# Patient Record
Sex: Female | Born: 1970 | Race: White | Hispanic: No | Marital: Married | Smoking: Current every day smoker
Health system: Southern US, Community
[De-identification: ages and names within clinical notes are randomized; demographics above are authoritative.]

## PROBLEM LIST (undated history)

## (undated) DIAGNOSIS — R569 Unspecified convulsions: Secondary | ICD-10-CM

## (undated) DIAGNOSIS — E079 Disorder of thyroid, unspecified: Secondary | ICD-10-CM

## (undated) DIAGNOSIS — N858 Other specified noninflammatory disorders of uterus: Secondary | ICD-10-CM

## (undated) DIAGNOSIS — M51369 Other intervertebral disc degeneration, lumbar region without mention of lumbar back pain or lower extremity pain: Secondary | ICD-10-CM

## (undated) DIAGNOSIS — I7300 Raynaud's syndrome without gangrene: Secondary | ICD-10-CM

## (undated) DIAGNOSIS — T7840XA Allergy, unspecified, initial encounter: Secondary | ICD-10-CM

## (undated) DIAGNOSIS — K219 Gastro-esophageal reflux disease without esophagitis: Secondary | ICD-10-CM

## (undated) DIAGNOSIS — M5136 Other intervertebral disc degeneration, lumbar region: Secondary | ICD-10-CM

## (undated) DIAGNOSIS — Z9889 Other specified postprocedural states: Secondary | ICD-10-CM

## (undated) DIAGNOSIS — T8859XA Other complications of anesthesia, initial encounter: Secondary | ICD-10-CM

## (undated) DIAGNOSIS — F419 Anxiety disorder, unspecified: Secondary | ICD-10-CM

## (undated) DIAGNOSIS — T4145XA Adverse effect of unspecified anesthetic, initial encounter: Secondary | ICD-10-CM

## (undated) DIAGNOSIS — F329 Major depressive disorder, single episode, unspecified: Secondary | ICD-10-CM

## (undated) DIAGNOSIS — F32A Depression, unspecified: Secondary | ICD-10-CM

## (undated) DIAGNOSIS — R112 Nausea with vomiting, unspecified: Secondary | ICD-10-CM

## (undated) DIAGNOSIS — G8929 Other chronic pain: Secondary | ICD-10-CM

## (undated) HISTORY — DX: Unspecified convulsions: R56.9

## (undated) HISTORY — DX: Disorder of thyroid, unspecified: E07.9

## (undated) HISTORY — DX: Other specified noninflammatory disorders of uterus: N85.8

## (undated) HISTORY — PX: TUBAL LIGATION: SHX77

## (undated) HISTORY — PX: BREAST SURGERY: SHX581

## (undated) HISTORY — DX: Major depressive disorder, single episode, unspecified: F32.9

## (undated) HISTORY — DX: Allergy, unspecified, initial encounter: T78.40XA

## (undated) HISTORY — PX: LAPAROSCOPIC TUBAL LIGATION: SUR803

## (undated) HISTORY — DX: Depression, unspecified: F32.A

---

## 2006-08-30 ENCOUNTER — Inpatient Hospital Stay (HOSPITAL_COMMUNITY): Admission: AD | Admit: 2006-08-30 | Discharge: 2006-08-30 | Payer: Self-pay | Admitting: Obstetrics and Gynecology

## 2007-01-01 ENCOUNTER — Inpatient Hospital Stay (HOSPITAL_COMMUNITY): Admission: RE | Admit: 2007-01-01 | Discharge: 2007-01-05 | Payer: Self-pay | Admitting: Obstetrics and Gynecology

## 2007-06-19 ENCOUNTER — Encounter: Admission: RE | Admit: 2007-06-19 | Discharge: 2007-07-06 | Payer: Self-pay | Admitting: Obstetrics and Gynecology

## 2007-07-15 ENCOUNTER — Encounter: Admission: RE | Admit: 2007-07-15 | Discharge: 2007-07-19 | Payer: Self-pay | Admitting: Obstetrics and Gynecology

## 2008-09-07 ENCOUNTER — Inpatient Hospital Stay (HOSPITAL_COMMUNITY): Admission: AD | Admit: 2008-09-07 | Discharge: 2008-09-08 | Payer: Self-pay | Admitting: Obstetrics and Gynecology

## 2008-09-08 ENCOUNTER — Encounter: Payer: Self-pay | Admitting: Obstetrics and Gynecology

## 2008-10-30 ENCOUNTER — Encounter (INDEPENDENT_AMBULATORY_CARE_PROVIDER_SITE_OTHER): Payer: Self-pay | Admitting: Obstetrics and Gynecology

## 2008-10-30 ENCOUNTER — Inpatient Hospital Stay (HOSPITAL_COMMUNITY): Admission: RE | Admit: 2008-10-30 | Discharge: 2008-11-02 | Payer: Self-pay | Admitting: Obstetrics and Gynecology

## 2009-06-08 IMAGING — US US ABDOMEN COMPLETE
1 series · 14 of 25 positions shown · non-contrast
Comparison: None

CLINICAL DATA: Epigastric abdominal pain and nausea.  31 weeks
pregnant.

ABDOMINAL ULTRASOUND COMPLETE

[Series 1: us abdomen complete · 0.25mm/px · 14 of 71 slices shown]
[im 1/71]
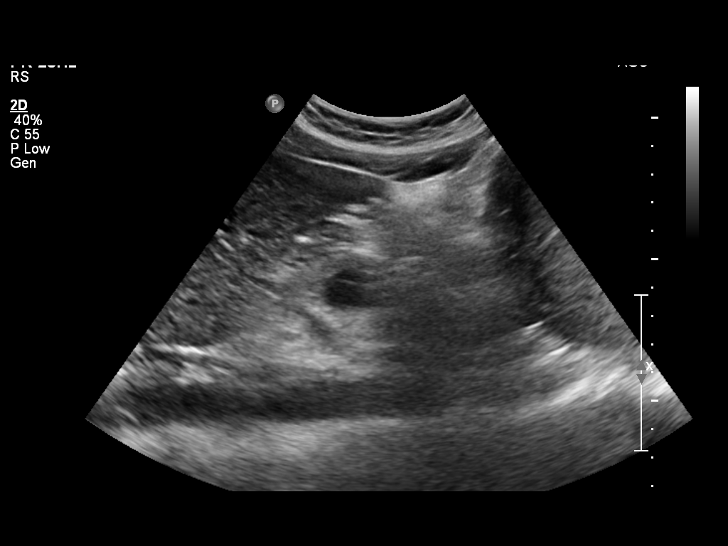
[im 6/71]
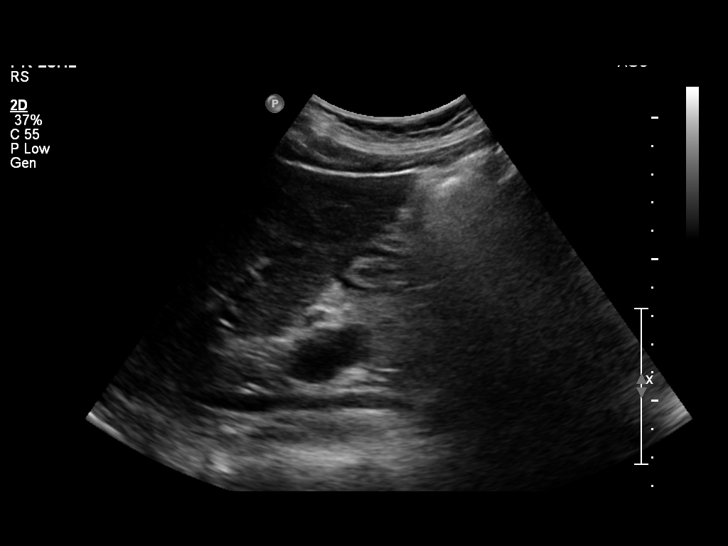
[im 12/71]
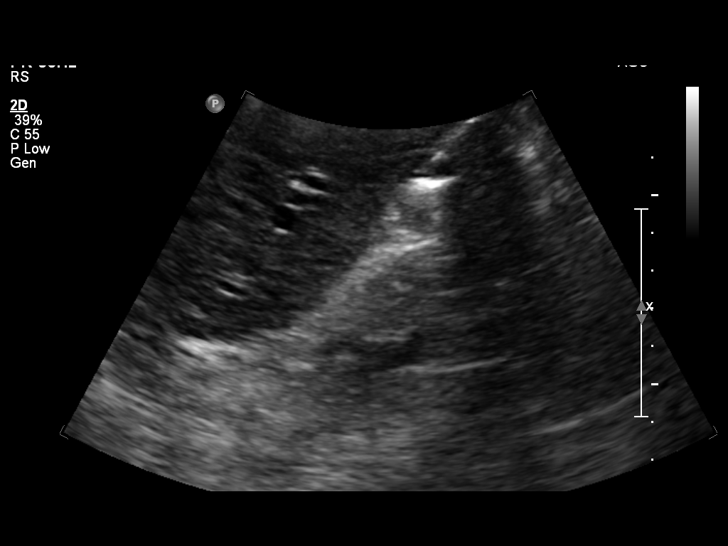
[im 18/71]
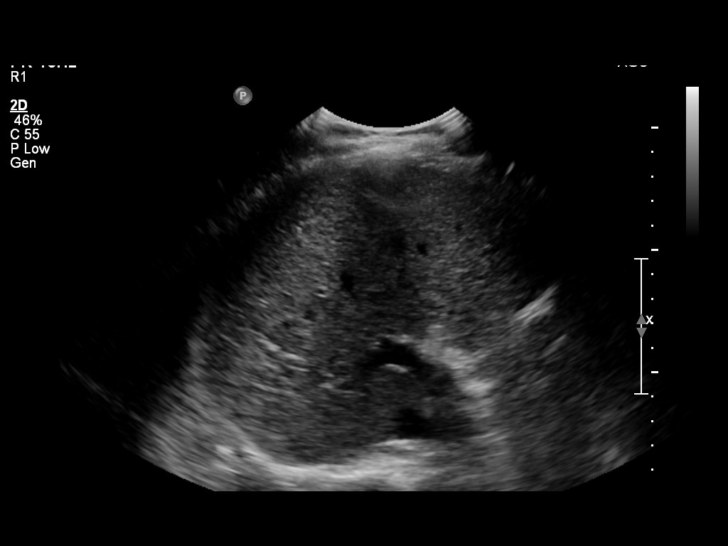
[im 24/71]
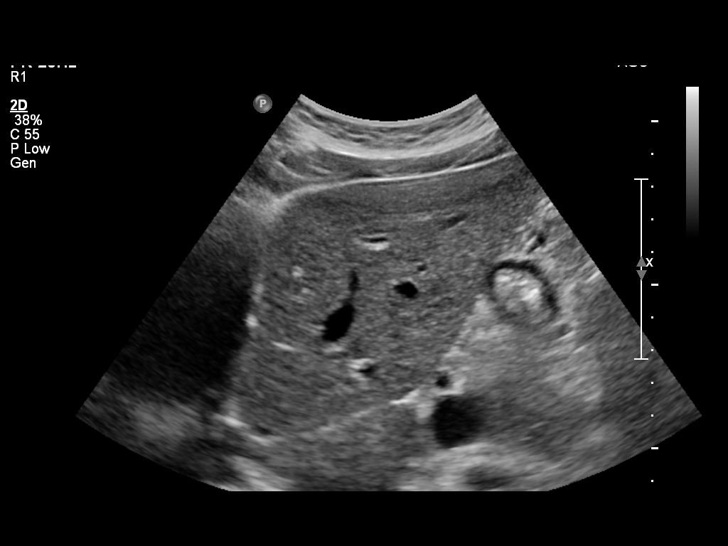
[im 27/71]
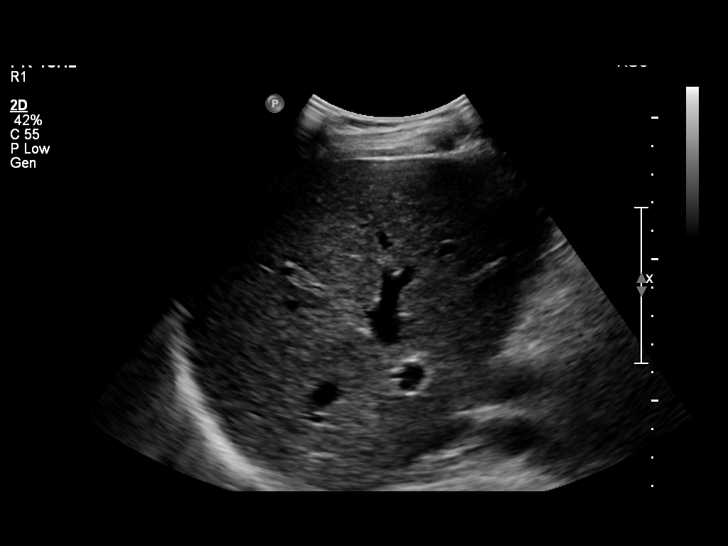
[im 33/71]
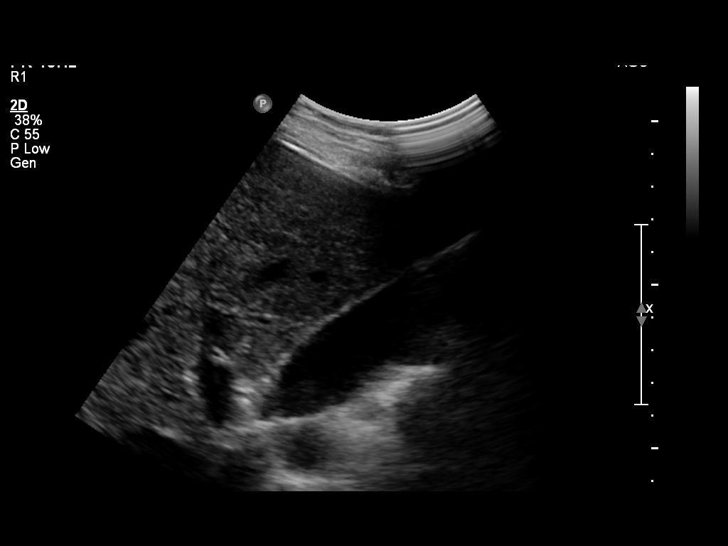
[im 38/71]
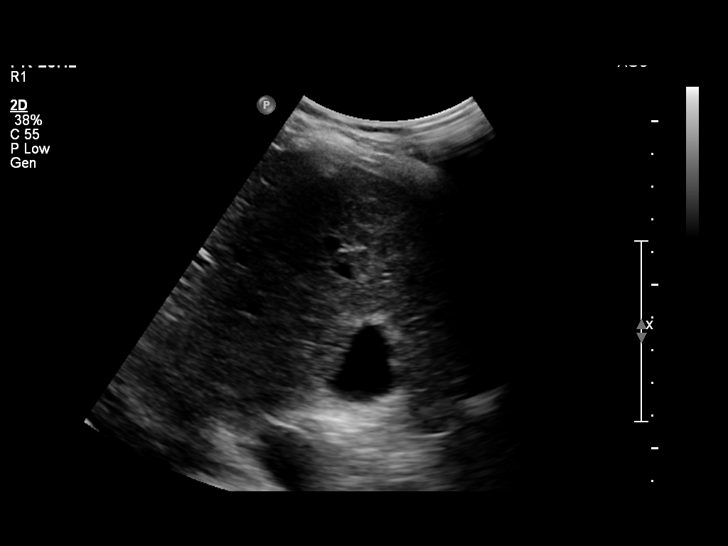
[im 44/71]
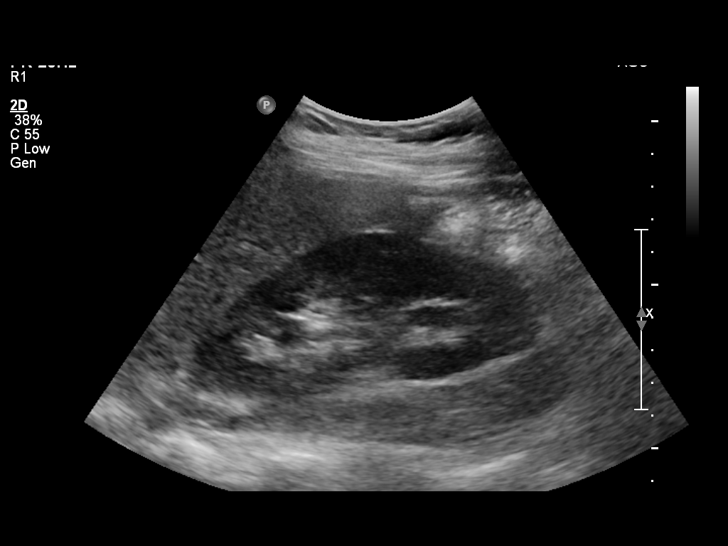
[im 47/71]
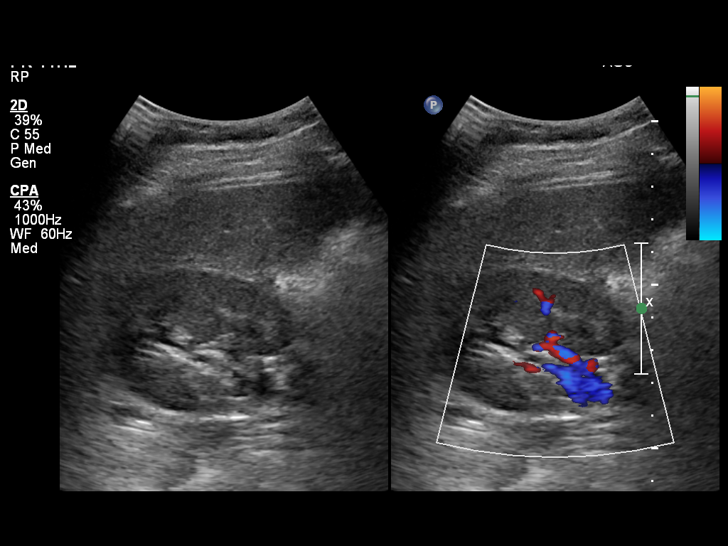
[im 53/71]
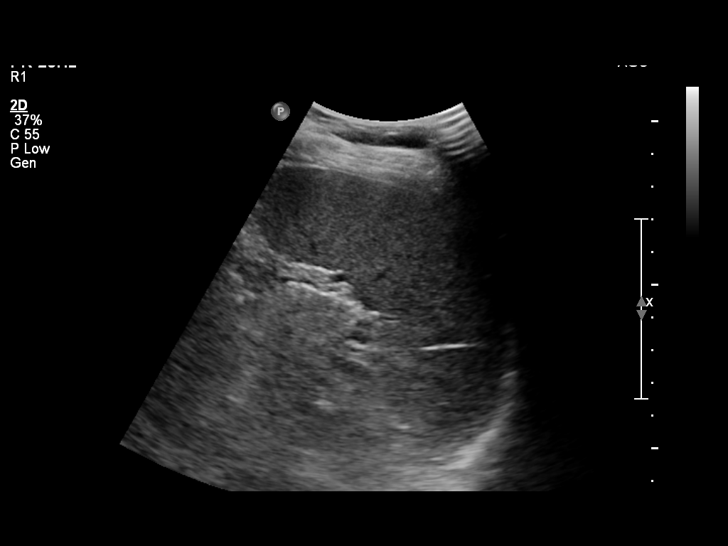
[im 59/71]
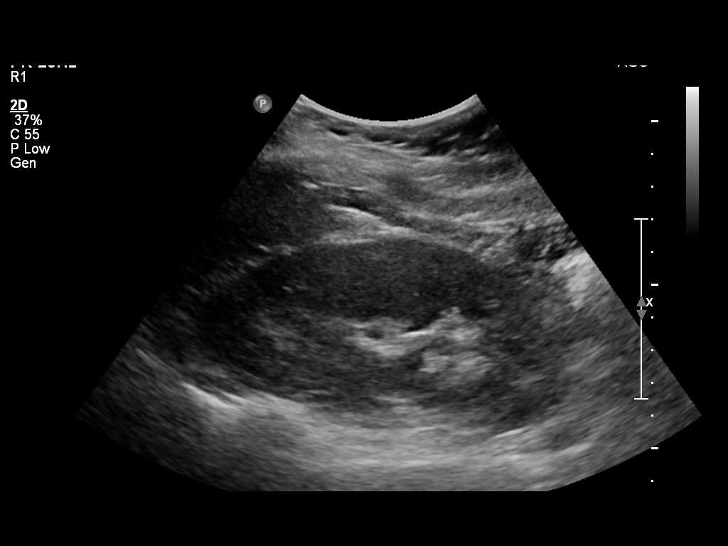
[im 65/71]
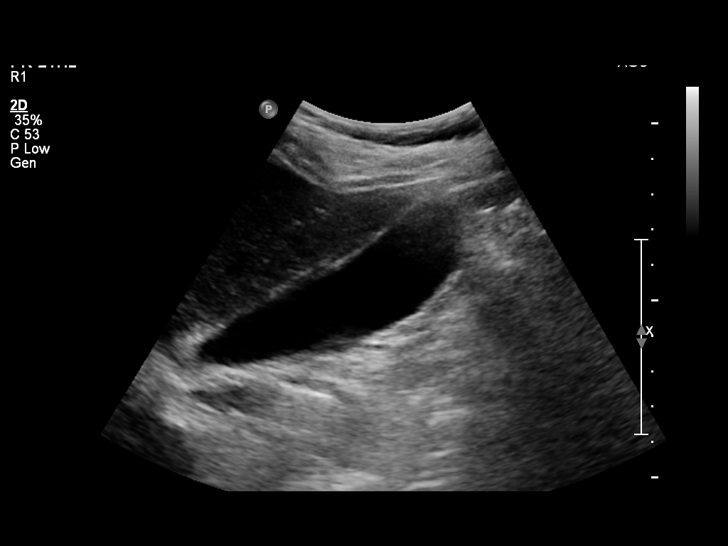
[im 71/71]
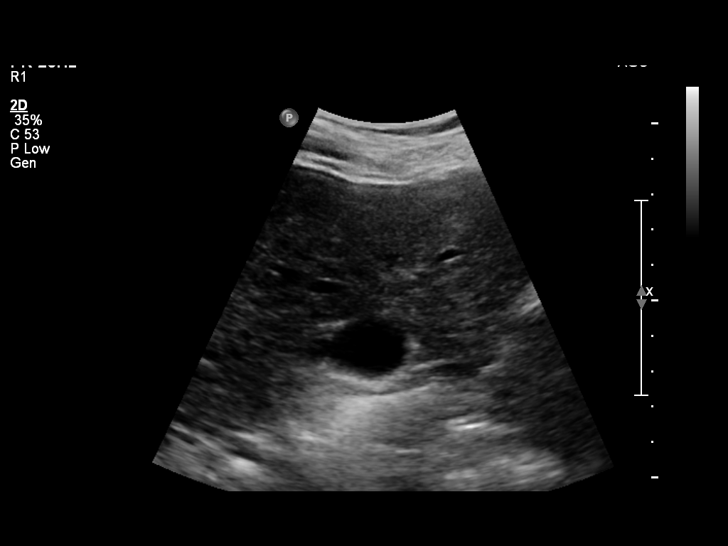

[14 of 25 positions shown; findings below may reference images not displayed]

FINDINGS: Gallbladder:  No gallstones, gallbladder wall thickening, or
pericholecystic fluid.

Common bile duct: Within normal limits in caliber.

Liver:  No focal lesion identified.  Within normal limits in
parenchymal echogenicity.

Inferior vena cava:  Intrahepatic portion appears normal.

Pancreas:  Although entire pancreas not visualized due to bowel
gas, no abnormality identified.

Spleen:  Within normal limits in size and echogenicity.

Right kidney:  Normal in size and parenchymal echogenicity.  No
evidence of mass or hydronephrosis.

Left kidney:  Normal in size and parenchymal echogenicity.   No
evidence of mass or hydronephrosis.

Abdominal aorta:  No aneurysm identified.
IMPRESSION: Negative abdominal ultrasound.  No evidence of gallstones or
hydronephrosis.

## 2010-06-22 ENCOUNTER — Encounter (INDEPENDENT_AMBULATORY_CARE_PROVIDER_SITE_OTHER): Payer: Self-pay | Admitting: *Deleted

## 2010-06-29 NOTE — Letter (Signed)
Summary: New Patient letter  [facility]  [address]   [place], [place] [address]   Phone: [phone]  Fax: [phone]       06/22/2010 MRN: [id number]  [facility] [place] [address] [place], [place]  [address]  Dear Ms. Elmore,  Welcome to the Gastroenterology Division at [facility].    You are scheduled to see Dr.  Jarold Motto on 07/28/2010 at 10:45 on the 3rd floor at [facility], [address]  We ask that you try to arrive at our office 15 minutes prior to your appointment time to allow for check-in.  We would like you to complete the enclosed self-administered evaluation form prior to your visit and bring it with you on the day of your appointment.  We will review it with you.  Also, please bring a complete list of all your medications or, if you prefer, bring the medication bottles and we will list them.  Please bring your insurance card so that we may make a copy of it.  If your insurance requires a referral to see a specialist, please bring your referral form from your primary care physician.  Co-payments are due at the time of your visit and may be paid by cash, check or credit card.     Your office visit will consist of a consult with your physician (includes a physical exam), any laboratory testing he/she may order, scheduling of any necessary diagnostic testing (e.g. x-ray, ultrasound, CT-scan), and scheduling of a procedure (e.g. Endoscopy, Colonoscopy) if required.  Please allow enough time on your schedule to allow for any/all of these possibilities.    If you cannot keep your appointment, please call [phone] to cancel or reschedule prior to your appointment date.  This allows [place] the opportunity to schedule an appointment for another patient in need of care.  If you do not cancel or reschedule by 5 p.m. the business day prior to your appointment date, you will be charged a $50.00 late cancellation/no-show fee.    Thank you for choosing  [facility] for your medical needs.  We appreciate the opportunity to care for you.  Please visit [place] at our website  to learn more about our practice.                     Sincerely,                                                             The Gastroenterology Division

## 2010-07-28 ENCOUNTER — Ambulatory Visit: Payer: Self-pay | Admitting: Gastroenterology

## 2010-08-15 LAB — TYPE AND SCREEN: Antibody Screen: NEGATIVE

## 2010-08-15 LAB — CBC
HCT: 31.1 % — ABNORMAL LOW (ref 36.0–46.0)
Hemoglobin: 11 g/dL — ABNORMAL LOW (ref 12.0–15.0)
Hemoglobin: 8.4 g/dL — ABNORMAL LOW (ref 12.0–15.0)
RBC: 2.64 MIL/uL — ABNORMAL LOW (ref 3.87–5.11)
RBC: 3.47 MIL/uL — ABNORMAL LOW (ref 3.87–5.11)
WBC: 7.3 10*3/uL (ref 4.0–10.5)

## 2010-08-15 LAB — CCBB MATERNAL DONOR DRAW

## 2010-08-16 LAB — COMPREHENSIVE METABOLIC PANEL
ALT: 18 U/L (ref 0–35)
Albumin: 2.8 g/dL — ABNORMAL LOW (ref 3.5–5.2)
Alkaline Phosphatase: 77 U/L (ref 39–117)
BUN: 7 mg/dL (ref 6–23)
Chloride: 109 mEq/L (ref 96–112)
Glucose, Bld: 107 mg/dL — ABNORMAL HIGH (ref 70–99)
Potassium: 3.6 mEq/L (ref 3.5–5.1)
Sodium: 135 mEq/L (ref 135–145)
Total Bilirubin: 0.6 mg/dL (ref 0.3–1.2)

## 2010-08-16 LAB — D-DIMER, QUANTITATIVE

## 2010-08-16 LAB — CBC
HCT: 31.2 % — ABNORMAL LOW (ref 36.0–46.0)
MCV: 92.3 fL (ref 78.0–100.0)
RBC: 3.38 MIL/uL — ABNORMAL LOW (ref 3.87–5.11)
WBC: 6.7 10*3/uL (ref 4.0–10.5)

## 2010-08-16 LAB — LIPASE, BLOOD: Lipase: 20 U/L (ref 11–59)

## 2010-09-20 NOTE — Discharge Summary (Signed)
[name], [name]               ACCOUNT NO.:  [id number]   MEDICAL RECORD NO.:  [id number]          PATIENT TYPE:  INP   LOCATION:  9108                          [facility]ILITY:  [facility]   PHYSICIAN:  Michelle L. Grewal, M.D.DATE OF BIRTH:  1971-02-27   DATE OF ADMISSION:  10/30/2008  DATE OF DISCHARGE:  11/02/2008                               DISCHARGE SUMMARY   ADMITTING DIAGNOSES:  1. Intrauterine pregnancy at term.  2. Previous cesarean section, desires repeat.   DISCHARGE DIAGNOSES:  1. Status post low transverse cesarean section.  2. A viable female infant.   PROCEDURE:  Repeat low transverse cesarean section.   REASON FOR ADMISSION:  Please see dictated H and P.   HOSPITAL COURSE:  The patient is a 40 year old gravida 70, para 5-0-5-5,  that was admitted to [facility] for scheduled cesarean  section.  The patient had had 5 previous cesarean deliveries and desired  repeat.  On the morning of admission, the patient was taken to the  operating room, where spinal anesthesia was administered without  difficulty.  A low transverse incision was made, delivery of a viable  female infant, weighing 8 pounds 5 ounces with Apgars of 9 at 1 minute and  9 at 5 minutes.  The patient tolerated the procedure well and was taken  to the recovery room in stable condition.  On postoperative day #1, the  patient was without complaint.  Vital signs were stable.  She was  afebrile.  Abdomen was soft.  The abdominal dressings was noted to have  a small amount of drainage noted on the bandage.  Foley had been  discontinued and she is voiding well.  Laboratory findings showed  hemoglobin 8.4, platelet count 177,000.  On postoperative day #2, the  patient was considering early discharge.  Vital signs were stable.  Abdomen was soft.  Fundus was firm and nontender.  Incision was clean,  dry, and intact with subcuticular closure.  Discharge instructions  reviewed, however, the patient later  decided against discharge.  On  postoperative day #3, the patient was without complaint.  Vital signs  remained stable.  She was afebrile.  Fundus was firm and nontender.  Incision was clean, dry, and intact.  Discharge instructions reviewed  and the patient was later discharged home.   CONDITION ON DISCHARGE:  Stable.   DIET:  Regular as tolerated.   ACTIVITY:  No heavy lifting, no driving x2 weeks, and no vaginal entry.   FOLLOWUP:  The patient is to follow up in the office in 1-2 weeks for an  incision check.  She is to call for temperature greater than 100  degrees, persistent nausea, vomiting, heavy vaginal bleeding, and/or  redness or drainage from the incisional site.   DISCHARGE MEDICATIONS:  1. Percocet 5/325, #30 one p.o. q.4-6 h. p.r.n.  2. Motrin 600 mg q.6 h.  3. Prenatal vitamins 1 p.o. daily.      Julio Sicks, N.P.      Stann Mainland. Vincente Poli, M.D.  Electronically Signed    CC/MEDQ  D:  11/02/2008  T:  11/02/2008  Job:  [id number]

## 2010-09-20 NOTE — Op Note (Signed)
[name], [name]               ACCOUNT NO.:  [id number]   MEDICAL RECORD NO.:  [id number]          PATIENT TYPE:  INP   LOCATION:  9110                          [facility]ACILITY:  [facility]   PHYSICIAN:  Duke Salvia. Marcelle Overlie, M.D.DATE OF BIRTH:  Feb 02, 1971   DATE OF PROCEDURE:  01/01/2007  DATE OF DISCHARGE:                               OPERATIVE REPORT   PREOPERATIVE DIAGNOSIS:  Term intrauterine pregnancy, previous cesarean  section x4.   POSTOPERATIVE DIAGNOSIS:  Term intrauterine pregnancy, previous cesarean  section x4.   PROCEDURE:  Repeat low transverse cesarean section.   SURGEON:  Duke Salvia. Marcelle Overlie, M.D.   ANESTHESIA:  Spinal.   COMPLICATIONS:  None.   DRAINS:  Foley catheter.   BLOOD LOSS:  700 mL.   PROCEDURE AND FINDINGS:  The patient taken to operating room.  After an  adequate level of spinal anesthetic was obtained with the patient in a  supine position, the abdomen prepped and draped in usual manner for  sterile abdominal procedure.  Foley catheter positioned draining clear  urine.  Transverse incision made excising the old scar which was carried  down to the fascia.  The fascia incised and extended transversely.  Rectus muscle divided in the midline.  Peritoneum entered superiorly  without incident and extended vertically.  The vesicouterine serosa was  then incised and the bladder was bluntly and sharply dissected below,  bladder blade repositioned.  Transverse incision made in the lower  segment extended with bandage scissors.  Clear fluid noted.  The patient  then delivered of a healthy female Apgars 9 and 9, weight 6 pounds 15  ounces.  The infant was suctioned, cord clamped and passed to pediatric  team for further care.  Placenta was then delivered manually intact.  Uterus was exteriorized, cavity wiped clean with laparotomy pack.  Closure obtained first layer 0 chromic in a locked fashion followed by  an imbricating layer of 0 chromic.  This was hemostatic.   The bladder  flap area was inspected carefully and noted be intact hemostatic with  clear urine at that point.  Bilateral tubes and ovaries were normal.  Prior to closure sponge, needle and instrument counts reported as  correct x2.  Peritoneum closed with running 2-0 Vicryl suture.  Rectus  muscles were reapproximated with 3-0 Vicryl interrupted sutures.  The 0  PDS running suture used from laterally to  midline on either side.  The subcutaneous tissue was undermined to  reduce tension, made hemostatic with the Bovie.  Skin was stapled with  Steri-Strips and a pressure dressing applied.  Clear urine noted at the  end of the case.  She did receive Ancef 1 gram IV along Pitocin IV after  the cord was clamped.  Mother and baby doing well at that point.      [name] Marcelle Overlie, M.D.  Electronically Signed     RMH/MEDQ  D:  01/01/2007  T:  01/02/2007  Job:  [id number]

## 2010-09-20 NOTE — H&P (Signed)
[name], [name]               ACCOUNT NO.:  [id number]   MEDICAL RECORD NO.:  [id number]          PATIENT TYPE:  INP   LOCATION:                                [facility]   PHYSICIAN:  Duke Salvia. Marcelle Overlie, M.D.    DATE OF BIRTH:   DATE OF ADMISSION:  01/01/2007  DATE OF DISCHARGE:                              HISTORY & PHYSICAL   Date of scheduled surgery is August 26.   CHIEF COMPLAINT:  Repeat cesarean section at term.   HISTORY OF PRESENT ILLNESS:  A 40 year old G9, P4-0-4-4 at term.  Had a  normal genetic amniocentesis who presents now for RCS.  She has had  three previous cesarean sections and has also had a tubal reversal.  This procedure including risks of bleeding, infection, transfusion,  possible need for additional surgery, other risks related to wound  infection, phlebitis, adjacent organ injury all reviewed with her which  she understands and accepts.   ALLERGIES:  None.   OPERATIONS:  Cesarean section, tubal reversal.   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   PHYSICAL EXAMINATION:  VITAL SIGNS:  Temperature 98.2, blood pressure  120/78.  HEENT:  Unremarkable.  NECK:  Supple without masses.  LUNGS:  Clear.  CARDIOVASCULAR:  Regular rate and rhythm without murmurs, rubs or  gallops.  BREASTS:  Without masses.  ABDOMEN:  Term fundal height, fetal heart rate 140, cervix closed.  EXTREMITIES/NEUROLOGICAL:  Unremarkable.   IMPRESSION:  1. Term intrauterine pregnancy.  2. Previous cesarean section x4.   PLAN:  Repeat cesarean section.  Procedure and risks reviewed as above.      [name] Marcelle Overlie, M.D.  Electronically Signed     RMH/MEDQ  D:  12/31/2006  T:  12/31/2006  Job:  [id number]

## 2010-09-20 NOTE — H&P (Signed)
NAMENIHAL, DOAN               ACCOUNT NO.:  [id number]   MEDICAL RECORD NO.:  [id number]          PATIENT TYPE:  INP   LOCATION:                                [facility]   PHYSICIAN:  Duke Salvia. Marcelle Overlie, M.D.DATE OF BIRTH:  Nov 11, 1970   DATE OF ADMISSION:  DATE OF DISCHARGE:                              HISTORY & PHYSICAL   CHIEF COMPLAINT:  For repeat cesarean section.   HISTORY OF PRESENT ILLNESS:  A 40 year old G11, P 5-0-5-5 who underwent  amniocentesis with this pregnancy with a normal karyotype.  She has had  5 previous cesarean sections.  The number of medical issues with this  pregnancy including depression (currently on Zoloft), chronic back pain  (which is being managed by her back specialist, for which she takes  narcotics or muscle relaxers p.r.n.).  Her history is also significant  for a history of prior tubal reversal and a history of gestational  diabetes.   As mentioned, she had a normal amniocentesis this pregnancy.  Her GBS  was negative.  She has used Skelaxin and Lidoderm topical patches, along  with narcotics for her chronic back pain.   The procedure of repeat cesarean section, including risks related to  bleeding, infection, adjacent organ injury are all reviewed with her;  transfusion risks, wound infection all discussed.   PAST MEDICAL HISTORY:  Please see her [employer] form for details.   ALLERGIES:  CT DYE, AUGMENTIN and CEFTIN.   SURGICAL HISTORY:  Please see the [employer] form.  1. Cesarean section times 5 previously.  2. Tubal reversal by Dr. Elesa Hacker.  3. Breast augmentation.   PHYSICAL EXAMINATION:  Temperature 98.2, blood pressure 128/72.  HEENT:  Unremarkable.  NECK:  Supple without masses.  LUNGS:  Clear.  CARDIOVASCULAR:  Regular rate and rhythm without murmurs, rubs or  gallops.  BREASTS:  Revealed implants.  No masses noted.  PELVIC:  Term fundal height.  Fetal heart rate 140.  Cervix is closed.  EXTREMITIES:  Unremarkable.  NEUROLOGIC:  Unremarkable.  Fetal heart rate 140.   IMPRESSION:  Term intrauterine pregnancy, previous cesarean section x5.   MEDICAL PROBLEMS:  As noted above.  Normal karyotype early in the  pregnancy; had amniocentesis.   PLAN:  Repeat cesarean section.  The procedure and risks reviewed as  above.      [name] Marcelle Overlie, M.D.  Electronically Signed     RMH/MEDQ  D:  10/29/2008  T:  10/29/2008  Job:  [id number]

## 2010-09-20 NOTE — Op Note (Signed)
[name], [name]               ACCOUNT NO.:  [id number]   MEDICAL RECORD NO.:  [id number]          PATIENT TYPE:  INP   LOCATION:  9108                          [facility]   PHYSICIAN:  Duke Salvia. Marcelle Overlie, M.D.DATE OF BIRTH:  17-Dec-1970   DATE OF PROCEDURE:  10/30/2008  DATE OF DISCHARGE:                               OPERATIVE REPORT   PREOPERATIVE DIAGNOSIS:  Repeat cesarean section at term.   POSTOPERATIVE DIAGNOSIS:  Repeat cesarean section at term.   PROCEDURE:  Repeat low transverse cesarean section.   SURGEON:  Duke Salvia. Marcelle Overlie, MD   ANESTHESIA:  Spinal.   COMPLICATIONS:  None.   DRAINS:  Foley catheter.   BLOOD LOSS:  800.   PROCEDURE AND FINDINGS:  The patient was taken to the operating room  after an adequate level of spinal anesthetic was obtained with the  patient supine in left tilt position.  The abdomen prepped and draped in  usual manner for sterile procedures.  Foley catheter positioned draining  clear urine.  Transverse incision was made excising the old scar,  carried down to the fascia which was incised and extended transversely.  Rectus muscle divided in the midline.  Peritoneum entered superiorly  without incident and extended in a vertical fashion.  The bladder blade  was positioned.  The vesicouterine serosa was then incised, and sharp  and blunt dissection used to dissect the bladder below.  Bladder blade  repositioned.  Transverse incision made in the lower segment, extended  with blunt dissection.  Clear fluid noted.  The patient delivered of a  healthy female, Apgars were 9 and 9.  The infant was suctioned, cord  clamped and passed to pediatric team for further care.  Placenta removed  manually intact, was sent for cord blood donation and then on to  Pathology.  The uterus exteriorized, cavity wiped and cleaned with  laparotomy pack and closure obtained with first layer of 0 chromic in a  locked fashion followed by an imbricating layer of  0 chromic.  Several  interrupted 0 chromic suture were then used for complete hemostasis.  The bladder flap area was intact and hemostatic.  Clear urine noted at  that point.  Tubes and ovaries were normal.  Prior to closure, sponge,  needle, and instrument counts were reported as correct x2.  Peritoneum  closed with a running 2-0 Vicryl suture.  Fascia closed from laterally  to midline on either side with a 0 PDS suture.  The subcutaneous tissue  was  undermined to allow for better closure.  This was made hemostatic with  Bovie and irrigated with good hemostasis.  There was minimal  subcutaneous tissue.  A 4-0 Monocryl subcuticular was then used to close  the skin with a pressure dressing.  She tolerated this well, went to  recovery room in good condition.      [name] Marcelle Overlie, M.D.  Electronically Signed     RMH/MEDQ  D:  10/30/2008  T:  10/30/2008  Job:  [id number]

## 2010-09-23 NOTE — Discharge Summary (Signed)
[name], [name]               ACCOUNT NO.:  [id number]   MEDICAL RECORD NO.:  [id number]          PATIENT TYPE:  INP   LOCATION:  9110                          [facility]   PHYSICIAN:  Guy Sandifer. Henderson Cloud, M.D. DATE OF BIRTH:  January 26, 1971   DATE OF ADMISSION:  01/01/2007  DATE OF DISCHARGE:  01/05/2007                               DISCHARGE SUMMARY   ADMITTING DIAGNOSES:  1. Intrauterine pregnancy at term.  2. Previous cesarean delivery times four, desires repeat.   DISCHARGE DIAGNOSES:  1. Status post low transverse cesarean section.  2. Viable female infant.  3. Cellulitis, resolving.  4. Abdominal muscular pain, improving.   PROCEDURE:  Repeat low transverse cesarean section.   REASON FOR ADMISSION:  Please see dictated H&P.   HOSPITAL COURSE:  The patient is 40 year old gravida 14, para 4-0-4-4  that presents to [facility] at term for scheduled  cesarean section.  The patient had had three previous cesarean  deliveries and also had had a tubal reversible.  Pregnancy had been  otherwise uneventful.  On the morning of admission the patient was taken  to the operating room where spinal anesthesia was administered without  difficulty.  A Low transverse incision was made with delivery of a  viable female infant weighing 6 pounds 15 ounces at Apgars of 9 at 1  minute and 9 at 5 minutes.  The patient tolerated the procedure well and  was taken to the recovery room in stable condition.   On postoperative day #1, the patient stated she was unable to tolerate  Percocet, she had taken Tylenol #3 previously without difficulty.  Vital  signs were stable.  She was afebrile.  Abdomen soft with good return of  bowel function.  Fundus was firm and nontender.  Abdominal dressing  noted be clean, dry and intact.  Foley had been discontinued.  The  patient was voiding well.  Laboratory findings revealed hemoglobin of  10.2, platelet count of 209,000, wbc 12.7.  Blood type  was known to be  B+.  Pain medication was changed to Tylenol #3.  On the following  morning the patient did complain of some abdominal tenderness.  Vital  signs were stable.  She is afebrile.  Abdomen was soft with very tender  to palpation.  Erythema was noted superior to the incisional site to the  umbilicus.  Incision itself was clean, dry and intact.  Staples were and  intact.  The patient was started on Augmentin 875 mg one p.o. b.i.d.  Later that morning with pain seemed to be more tender in the left upper  quadrant, versus right upper quadrant.  No rebound tenderness, no nausea  and vomiting.  Later that evening the patient described the pain as a  burning type pain just below the skin.  She denied any pelvic pain.  Lochia seemed to be decreasing.  No nausea, vomiting or diarrhea.  She  did have active bowel sounds.  Vital signs were stable.  She was  afebrile.  Abdomen soft, tenderness was noted in the left upper and left  lower quadrant.  Erythema was noted around the incisional site which was  now marked with a pen.  It did feel warm to the touch.  The incision  itself was clean, dry and intact without drainage or induration. The  patient was now started on IV antibiotics with a CBC ordered.  Due to  pain being one-sided, it was not thought to be intraperitoneal bleeding  or injury.  On the following morning, the patient complained of some  abdominal pain, left greater than right.  Vital signs remained stable.  She was afebrile.  Abdomen was tender in left lower quadrant.  No  rebound tenderness, positive flatus, positive BM.  Fundus was firm, one  above U, nontender.  Erythema continued to be present with slight  increase over the previous marked area.  Laboratory findings revealed  hemoglobin of 9.1, platelet count of 218,000, WBC count of 11.6.  The  patient continued on IV antibiotics and CT scan was ordered.  On the  following morning,  pain was about the same, however, it  was manageable  with p.o. medications.  Vital signs were stable.  She was afebrile.  Abdomen was soft with some decrease in erythema noted.  CT was normal.  Discharge instructions were reviewed and the patient was later  discharged home.   CONDITION ON DISCHARGE:  Stable.   DIET:  Regular as tolerated.   ACTIVITY:  No heavy lifting, no driving x2 weeks, no vaginal entry.   FOLLOW UP:  Patient to follow up in the office in 3-4 days.  She is to  call for temperature greater than 100 degrees, persistent nausea,  vomiting, heavy vaginal bleeding and/or further redness or drainage from  incisional site.   DISCHARGE MEDICATIONS:  1. Augmentin 500 mg one p.o. b.i.d. x14 days.  2. Tylenol No. 3 #40 one p.o. every 4 hours as needed for pain.  3. Prenatal vitamins one p.o. daily.      Julio Sicks, N.P.      Guy Sandifer. Henderson Cloud, M.D.  Electronically Signed    CC/MEDQ  D:  02/05/2007  T:  02/05/2007  Job:  [id number]

## 2011-02-17 LAB — CBC
HCT: 28.5 — ABNORMAL LOW
HCT: 29.4 — ABNORMAL LOW
HCT: 35.4 — ABNORMAL LOW
Hemoglobin: 10.2 — ABNORMAL LOW
Hemoglobin: 9.9 — ABNORMAL LOW
MCHC: 34.2
MCHC: 34.7
MCHC: 34.7
MCV: 89.9
MCV: 90.7
MCV: 90.9
RBC: 2.87 — ABNORMAL LOW
RBC: 3.15 — ABNORMAL LOW
RBC: 3.26 — ABNORMAL LOW
RBC: 3.94
RDW: 13.9
RDW: 14
RDW: 14.1 — ABNORMAL HIGH
WBC: 12.3 — ABNORMAL HIGH
WBC: 12.7 — ABNORMAL HIGH

## 2011-02-17 LAB — TYPE AND SCREEN

## 2011-02-17 LAB — ABO/RH: ABO/RH(D): B POS

## 2011-02-17 LAB — RPR: RPR Ser Ql: NONREACTIVE

## 2011-09-26 ENCOUNTER — Other Ambulatory Visit: Payer: Self-pay | Admitting: Orthopedic Surgery

## 2011-09-26 DIAGNOSIS — M25561 Pain in right knee: Secondary | ICD-10-CM

## 2011-09-26 DIAGNOSIS — M25521 Pain in right elbow: Secondary | ICD-10-CM

## 2011-10-01 ENCOUNTER — Other Ambulatory Visit: Payer: Self-pay

## 2012-01-19 ENCOUNTER — Encounter: Payer: Self-pay | Admitting: Physical Medicine & Rehabilitation

## 2012-01-22 ENCOUNTER — Encounter: Payer: Self-pay | Admitting: Physical Medicine & Rehabilitation

## 2012-01-22 ENCOUNTER — Ambulatory Visit (HOSPITAL_BASED_OUTPATIENT_CLINIC_OR_DEPARTMENT_OTHER): Payer: No Typology Code available for payment source | Admitting: Physical Medicine & Rehabilitation

## 2012-01-22 ENCOUNTER — Encounter: Payer: BC Managed Care – PPO | Attending: Physical Medicine & Rehabilitation

## 2012-01-22 VITALS — BP 104/37 | HR 82 | Resp 14 | Ht 64.0 in | Wt 113.0 lb

## 2012-01-22 DIAGNOSIS — M5137 Other intervertebral disc degeneration, lumbosacral region: Secondary | ICD-10-CM

## 2012-01-22 DIAGNOSIS — M51369 Other intervertebral disc degeneration, lumbar region without mention of lumbar back pain or lower extremity pain: Secondary | ICD-10-CM | POA: Insufficient documentation

## 2012-01-22 DIAGNOSIS — IMO0001 Reserved for inherently not codable concepts without codable children: Secondary | ICD-10-CM

## 2012-01-22 DIAGNOSIS — M545 Low back pain, unspecified: Secondary | ICD-10-CM | POA: Insufficient documentation

## 2012-01-22 DIAGNOSIS — G894 Chronic pain syndrome: Secondary | ICD-10-CM | POA: Insufficient documentation

## 2012-01-22 DIAGNOSIS — M7918 Myalgia, other site: Secondary | ICD-10-CM | POA: Insufficient documentation

## 2012-01-22 DIAGNOSIS — M549 Dorsalgia, unspecified: Secondary | ICD-10-CM

## 2012-01-22 DIAGNOSIS — M47816 Spondylosis without myelopathy or radiculopathy, lumbar region: Secondary | ICD-10-CM

## 2012-01-22 DIAGNOSIS — M5380 Other specified dorsopathies, site unspecified: Secondary | ICD-10-CM

## 2012-01-22 DIAGNOSIS — M546 Pain in thoracic spine: Secondary | ICD-10-CM | POA: Insufficient documentation

## 2012-01-22 DIAGNOSIS — Z5181 Encounter for therapeutic drug level monitoring: Secondary | ICD-10-CM

## 2012-01-22 DIAGNOSIS — M5136 Other intervertebral disc degeneration, lumbar region: Secondary | ICD-10-CM

## 2012-01-22 NOTE — Progress Notes (Signed)
Subjective:    Patient ID: [name], female    DOB: 03-Oct-1970, 41 y.o.   MRN: [id number]  HPI Chronic thoracic pain. Teen years. She's been evaluated at [facility] and was treated at the pain management Center by Dr. Jonelle Sports. RFOf the thoracic facets had been performed in the past with good results. She's also had intermittent lumbar radiofrequency perhaps once a year. More recently she's had an aggravation of her low back pain. She was picking up her son and twisting in a chair. Repeat MRI performed 01/03/2012 showed shallow posterior bulges at L3-L4 and L4-L5. No evidence of spinal stenosis. She's also been treated with trigger point injections with good relief. She continues with some home exercises provided by a physical therapist. 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 Now taking hydrocodone 10/325 4 times a day Pain Inventory Average Pain 4 Pain Right Now 5 My pain is sharp and aching  In the last 24 hours, has pain interfered with the following? General activity 8 Relation with others 10 Enjoyment of life 8 What TIME of day is your pain at its worst? day and night Sleep (in general) Poor  Pain is worse with: walking, bending, sitting and standing Pain improves with: rest, heat/ice, medication and injections Relief from Meds: 1  Mobility walk without assistance ability to climb steps?  yes do you drive?  yes  Function not employed: date last employed 08/1998 disabled: date disabled 07/2007 I need assistance with the following:  household duties and shopping  Neuro/Psych weakness trouble walking spasms  Prior Studies x-rays CT/MRI  Physicians involved in your care Mitchelle, Ray, Bosinbic   Family History  Problem Relation Age of Onset  . Alzheimer's disease Mother   . Hypertension Father   . Spinal muscular atrophy Father   . Arthritis Father   . Cancer Father    History    Social History  . Marital Status: Legally Separated    Spouse Name: N/A    Number of Children: N/A  . Years of Education: N/A   Social History Main Topics  . Smoking status: Current Every Day Smoker  . Smokeless tobacco: Never Used  . Alcohol Use: Yes  . Drug Use: None  . Sexually Active: None   Other Topics Concern  . None   Social History Narrative  . None   Past Surgical History  Procedure Date  . Cesarean section   . Breast surgery   . Tubal ligation    Past Medical History  Diagnosis Date  . Seizures   . Thyroid disease   . Allergy   . Depression    BP 104/37  Pulse 82  Resp 14  Ht 5\' 4"  (1.626 m)  Wt 113 lb (51.256 kg)  BMI 19.40 kg/m2  SpO2 99%     Review of Systems  Musculoskeletal: Positive for myalgias, back pain, arthralgias and gait problem.  Neurological: Positive for weakness.  All other systems reviewed and are negative.       Objective:   Physical Exam  Constitutional: She is oriented to person, place, and time. She appears well-developed and well-nourished.  Neck: Normal range of motion.  Musculoskeletal:       Thoracic back: She exhibits tenderness.       Back:       Trigger points tender on the right side at T6-T7 and T8  Neurological: She is alert and oriented to person,  place, and time. She has normal strength and normal reflexes. No sensory deficit. Coordination and gait normal.  Reflex Scores:      Tricep reflexes are 2+ on the right side and 2+ on the left side.      Bicep reflexes are 2+ on the right side and 2+ on the left side.      Brachioradialis reflexes are 2+ on the right side and 2+ on the left side.      Patellar reflexes are 2+ on the right side and 2+ on the left side.      Achilles reflexes are 2+ on the right side and 2+ on the left side. Psychiatric: Her speech is normal and behavior is normal. Judgment and thought content normal. Her mood appears anxious. Cognition and memory are normal.    Pain at  bilateral L4 and L5 pain is relieved with for flexion      Assessment & Plan:  1. Acute on chronic low back pain. I believe she has facet mediated pain in the lumbar area will set her up for medial branch blocks.  2. 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 Will do trigger point injections today. 3. Chronic pain syndrome has been on chronic narcotic analgesics no reports of aberrant drug behavior. We'll check urine drug screen. She does have a refill yet on her hydrocodone. She also takes Valium 10 mg 3 times a day as well as Ambien 5-10 mg each bedtime May need to adjust medications especially the Valium. Goal being to reduce dosage

## 2012-01-22 NOTE — Patient Instructions (Signed)
We did trigger point injections today in the thoracic area We will set you up for lumbar medial branch blocks L3-L4-L5

## 2012-01-22 NOTE — Progress Notes (Signed)
Trigger point injection Indication myofascial pain syndrome thoracic paraspinal musculature. Informed consent was obtained after describing risks and benefits of the procedure with the patient these include bleeding bruising infection as well as pneumothorax. She elects to proceed and has given written consent Patient placed in a forward flexed position over exam table. Paraspinal muscles 3 cm lateral to the spinous process of T6-T8 and T10 were marked and prepped with Betadine. They were then entered with a 25-gauge inch and a half needle after negative draw back for blood 1 cc of 1% lidocaine injected into each of 3 sites. Patient tolerated procedure well. Post procedure instructions given

## 2012-01-25 ENCOUNTER — Telehealth: Payer: Self-pay | Admitting: *Deleted

## 2012-01-25 NOTE — Telephone Encounter (Signed)
Inconsistent UDS. Negative for prescribed medication, Hydrocodone.  LM for pt to call back.

## 2012-01-25 NOTE — Telephone Encounter (Signed)
Pt states that she was out of medication when she gave her specimen.

## 2012-02-01 ENCOUNTER — Ambulatory Visit (HOSPITAL_BASED_OUTPATIENT_CLINIC_OR_DEPARTMENT_OTHER): Payer: No Typology Code available for payment source | Admitting: Physical Medicine & Rehabilitation

## 2012-02-01 ENCOUNTER — Encounter: Payer: Self-pay | Admitting: Physical Medicine & Rehabilitation

## 2012-02-01 VITALS — BP 119/49 | HR 105 | Resp 14 | Ht 64.0 in | Wt 110.0 lb

## 2012-02-01 DIAGNOSIS — M47816 Spondylosis without myelopathy or radiculopathy, lumbar region: Secondary | ICD-10-CM

## 2012-02-01 DIAGNOSIS — M47817 Spondylosis without myelopathy or radiculopathy, lumbosacral region: Secondary | ICD-10-CM

## 2012-02-01 MED ORDER — ZOLPIDEM TARTRATE 10 MG PO TABS: 10.0000 mg | ORAL_TABLET | Freq: Every evening | ORAL | Status: DC | PRN

## 2012-02-01 NOTE — Patient Instructions (Signed)
Please bring pill bottles to each visit Prescription for Ambien given Return in one month for injection if only temporary relief with this injection will repeat If no relief with this injection will do sacroiliac

## 2012-02-01 NOTE — Progress Notes (Signed)

## 2012-02-01 NOTE — Progress Notes (Signed)
  PROCEDURE RECORD [facility]   Name: [name] DOB:1970-08-08 MRN: [id number]  Date:02/01/2012  Physician: Claudette Laws, MD    Nurse/CMA: Noralyn Pick CMA  Allergies:  Allergies  Allergen Reactions  . Bee Venom   . Morphine And Related   . Reglan (Metoclopramide)   . Trazodone And Nefazodone     Consent Signed: yes  Is patient diabetic? no  CBG today?   Pregnant: no LMP: No LMP recorded. (age 42-55) 01/29/12  Anticoagulants: no Anti-inflammatory: no Antibiotics: no  Procedure: Bilateral Medial Branch Block  Position: Prone Start Time: 11:30am  End Time: 11:42am  Fluoro Time: 30 seconds  RN/CMA [name] CMA    Time 10:32 11:44am    BP 119/49 117/63    Pulse 105 101    Respirations 14 14    O2 Sat 100% 100    S/S 6 6    Pain Level 7/10 7/10     D/C home with Remigio Eisenmenger, patient A & O X 3, D/C instructions reviewed, and sits independently.

## 2012-02-29 ENCOUNTER — Ambulatory Visit (HOSPITAL_BASED_OUTPATIENT_CLINIC_OR_DEPARTMENT_OTHER): Payer: BC Managed Care – PPO | Admitting: Physical Medicine & Rehabilitation

## 2012-02-29 ENCOUNTER — Encounter: Payer: BC Managed Care – PPO | Attending: Physical Medicine & Rehabilitation

## 2012-02-29 ENCOUNTER — Encounter: Payer: Self-pay | Admitting: Physical Medicine & Rehabilitation

## 2012-02-29 VITALS — BP 107/54 | HR 80 | Resp 14 | Ht 64.0 in | Wt 116.8 lb

## 2012-02-29 DIAGNOSIS — G894 Chronic pain syndrome: Secondary | ICD-10-CM | POA: Insufficient documentation

## 2012-02-29 DIAGNOSIS — M546 Pain in thoracic spine: Secondary | ICD-10-CM | POA: Insufficient documentation

## 2012-02-29 DIAGNOSIS — M47817 Spondylosis without myelopathy or radiculopathy, lumbosacral region: Secondary | ICD-10-CM

## 2012-02-29 DIAGNOSIS — M545 Low back pain, unspecified: Secondary | ICD-10-CM | POA: Insufficient documentation

## 2012-02-29 DIAGNOSIS — IMO0001 Reserved for inherently not codable concepts without codable children: Secondary | ICD-10-CM | POA: Insufficient documentation

## 2012-02-29 MED ORDER — ZOLPIDEM TARTRATE 10 MG PO TABS: 10.0000 mg | ORAL_TABLET | Freq: Every evening | ORAL | Status: DC | PRN

## 2012-02-29 MED ORDER — DIAZEPAM 10 MG PO TABS: 10.0000 mg | ORAL_TABLET | Freq: Three times a day (TID) | ORAL | Status: DC | PRN

## 2012-02-29 NOTE — Progress Notes (Signed)
Bilateral Lumbar , L4  medial branch blocks and L 5 dorsal ramus injection under fluoroscopic guidance  Indication: Lumbar pain which is not relieved by medication management or other conservative care and interfering with self-care and mobility.  Informed consent was obtained after describing risks and benefits of the procedure with the patient, this includes bleeding, infection, paralysis and medication side effects.  The patient wishes to proceed and has given written consent.  The patient was placed in prone position.  The lumbar area was marked and prepped with Betadine.  One mL of 1% lidocaine was injected into each of 6 areas into the skin and subcutaneous tissue.  Then a 22-gauge 3.5 spinal needle was inserted targeting the junction of the left S1 superior articular process and sacral ala junction. Needle was advanced under fluoroscopic guidance.  Bone contact was made.  Omnipaque 180 was injected x 0.5 mL demonstrating no intravascular uptake.  Then a solution containing one mL of 4 mg per mL dexamethasone and 3 mL of 2% MPF lidocaine was injected x 0.5 mL.  Then the left L5 superior articular process in transverse process junction was targeted.  Bone contact was made.  Omnipaque 180 was injected x 0.5 mL demonstrating no intravascular uptake. Then a solution containing one mL of 4 mg per mL dexamethasone and 3 mL of 2% MPF lidocaine was injected x 0.5 mL. .  This same procedure was performed on the right side using the same needle, technique and injectate.  Patient tolerated procedure well.  Post procedure instructions were given.

## 2012-02-29 NOTE — Progress Notes (Signed)
  PROCEDURE RECORD [facility]   Name: [name] DOB:[birth year] MRN: [id number]  Date:02/29/2012  Physician: Claudette Laws, MD    Nurse/CMA: Jolleen Seman RN  Allergies:  Allergies  Allergen Reactions  . Betadine (Povidone Iodine)     Thyroid issues  . Bee Venom   . Morphine And Related   . Reglan (Metoclopramide)   . Trazodone And Nefazodone     Consent Signed: yes  Is patient diabetic? no  CBG today?   Pregnant: no LMP: No LMP recorded. (age 79-55)02/25/12  Anticoagulants: no Anti-inflammatory: no Antibiotics: no  Procedure: Bilateral Medial Branch Block Position: Prone   ALLERGY TO BETADINE:Chlorhexidine soap used Start Time:10:54 End Time:11:03  Fluoro Time: 28 sec  RN/CMA Designer, multimedia    Time 10:31 11:09    BP 107/54 122/43    Pulse 80 93    Respirations 14 14    O2 Sat 100 96    S/S 6 6    Pain Level 7/10 4/10     D/C home with daughter, patient A & O X 3, D/C instructions reviewed, and sits independently.

## 2012-02-29 NOTE — Patient Instructions (Signed)
Next visit will be for either a medial branch blocks or sacroiliac injection. If you're doing well we will just make an office visit

## 2012-03-28 ENCOUNTER — Encounter: Payer: BC Managed Care – PPO | Attending: Physical Medicine & Rehabilitation

## 2012-03-28 ENCOUNTER — Ambulatory Visit (HOSPITAL_BASED_OUTPATIENT_CLINIC_OR_DEPARTMENT_OTHER): Payer: No Typology Code available for payment source | Admitting: Physical Medicine & Rehabilitation

## 2012-03-28 ENCOUNTER — Encounter: Payer: Self-pay | Admitting: Physical Medicine & Rehabilitation

## 2012-03-28 VITALS — BP 128/56 | HR 103 | Resp 14 | Ht 64.0 in | Wt 123.0 lb

## 2012-03-28 DIAGNOSIS — G894 Chronic pain syndrome: Secondary | ICD-10-CM | POA: Insufficient documentation

## 2012-03-28 DIAGNOSIS — Z5181 Encounter for therapeutic drug level monitoring: Secondary | ICD-10-CM

## 2012-03-28 DIAGNOSIS — M546 Pain in thoracic spine: Secondary | ICD-10-CM | POA: Insufficient documentation

## 2012-03-28 DIAGNOSIS — M549 Dorsalgia, unspecified: Secondary | ICD-10-CM

## 2012-03-28 DIAGNOSIS — IMO0001 Reserved for inherently not codable concepts without codable children: Secondary | ICD-10-CM | POA: Insufficient documentation

## 2012-03-28 DIAGNOSIS — M545 Low back pain, unspecified: Secondary | ICD-10-CM | POA: Insufficient documentation

## 2012-03-28 MED ORDER — ZOLPIDEM TARTRATE 10 MG PO TABS: 10.0000 mg | ORAL_TABLET | Freq: Every evening | ORAL | Status: DC | PRN

## 2012-03-28 MED ORDER — DIAZEPAM 10 MG PO TABS: 10.0000 mg | ORAL_TABLET | Freq: Three times a day (TID) | ORAL | Status: DC | PRN

## 2012-03-28 NOTE — Progress Notes (Signed)
  Subjective:    Patient ID: [name], female    DOB: November 14, 1970, 40 y.o.   MRN: [id number]  HPI  Patient returns today. Her lower lumbar pain is improved after lumbar medial branch blocks On 10/24 13 And 02/01/2012 She has some upper lumbar pain. No new trauma no new injuries. It is right in the midline. She has been exercising more doing more stretching. She is using heat medications and it is gradually improving  Review of Systems     Objective:   Physical Exam  Musculoskeletal:       Lumbar back: She exhibits decreased range of motion and bony tenderness. She exhibits no tenderness, no swelling, no edema, no deformity and no spasm.  Neurological: Gait normal.          Assessment & Plan:  1. Lumbar spondylosis improvement after medial branch blocks. As I discussed with patient is difficult to predict how long the medial branch will last. I'll see her back in about 3 weeks and reassess. 2. Chronic pain syndrome Combination of thoracic degenerative disc,Thoracic facet syndrome as well as lumbar facet syndrome.  Her initial urine drug screen did not show any hydrocodone but says she was not taking hydrocodone on a regular basis at that time. She is now taking hydrocodone on a regular basis therefore we expect her urine drug stream to reflect this. Will repeat today. If consistent will take over her hydrocodone 10/325 4 times a day 3.  Lumbar strain there is no directional component other than lateral bending. It is midline on the spinous process and inter spinous ligament.Continue conservative treatment. If it becomes more severe and paravertebral would consider T12 L1-L2 Medial branch blocks

## 2012-03-28 NOTE — Progress Notes (Signed)
  PROCEDURE RECORD [facility]   Name: [name] DOB:December 24, 1970 MRN: [id number]  Date:03/28/2012  Physician: Claudette Laws, MD    Nurse/CMA: Kelli Churn, CMA  Allergies:  Allergies  Allergen Reactions  . Betadine (Povidone Iodine)     Thyroid issues  . Bee Venom   . Morphine And Related   . Reglan (Metoclopramide)   . Trazodone And Nefazodone     Consent Signed: yes  Is patient diabetic? no    Pregnant: no LMP: No LMP recorded. (age 44-55)  Anticoagulants: no Anti-inflammatory: no Antibiotics: no  Procedure:   Position: Prone Start Time:   End Time:   Fluoro Time:   RN/CMA Levens, CMA Levens,CMA    Time 11:34am     BP 128/56     Pulse 101     Respirations 14     O2 Sat 100     S/S 6 6    Pain Level 7/10      D/C home with Daughter-[name], patient A & O X 3, D/C instructions reviewed, and sits independently.

## 2012-03-28 NOTE — Patient Instructions (Signed)
Return to clinic approximately 3 weeks. If your pain in your back increases we will do a repeat injection if it is continuing to improve it will be an office visit We will do a urinary drug screen today we should have the results early next week and if it looks appropriate we can call in another prescription for your hydrocodone

## 2012-04-08 ENCOUNTER — Telehealth: Payer: Self-pay

## 2012-04-08 NOTE — Telephone Encounter (Signed)
Ok to call in Norco 10/325 #120 1 po QID

## 2012-04-08 NOTE — Telephone Encounter (Signed)
Patient called to follow up on UDS.

## 2012-04-08 NOTE — Telephone Encounter (Signed)
Patients UDS was consistent.  She is calling to get her hydrocodone filled.  Please advise.

## 2012-04-09 MED ORDER — HYDROCODONE-ACETAMINOPHEN 10-325 MG PO TABS: 1.0000 | ORAL_TABLET | Freq: Four times a day (QID) | ORAL | Status: DC | PRN

## 2012-04-09 NOTE — Telephone Encounter (Signed)
Norco called into [facility].  Left message advising patient medication had been called in.

## 2012-04-18 ENCOUNTER — Ambulatory Visit (HOSPITAL_BASED_OUTPATIENT_CLINIC_OR_DEPARTMENT_OTHER): Payer: No Typology Code available for payment source | Admitting: Physical Medicine & Rehabilitation

## 2012-04-18 ENCOUNTER — Encounter: Payer: Self-pay | Admitting: Physical Medicine & Rehabilitation

## 2012-04-18 ENCOUNTER — Encounter: Payer: BC Managed Care – PPO | Attending: Physical Medicine & Rehabilitation

## 2012-04-18 VITALS — BP 110/65 | HR 93 | Resp 14 | Ht 64.0 in | Wt 127.0 lb

## 2012-04-18 DIAGNOSIS — M545 Low back pain, unspecified: Secondary | ICD-10-CM | POA: Insufficient documentation

## 2012-04-18 DIAGNOSIS — IMO0001 Reserved for inherently not codable concepts without codable children: Secondary | ICD-10-CM | POA: Insufficient documentation

## 2012-04-18 DIAGNOSIS — M47816 Spondylosis without myelopathy or radiculopathy, lumbar region: Secondary | ICD-10-CM

## 2012-04-18 DIAGNOSIS — M5380 Other specified dorsopathies, site unspecified: Secondary | ICD-10-CM

## 2012-04-18 DIAGNOSIS — G894 Chronic pain syndrome: Secondary | ICD-10-CM | POA: Insufficient documentation

## 2012-04-18 DIAGNOSIS — M546 Pain in thoracic spine: Secondary | ICD-10-CM | POA: Insufficient documentation

## 2012-04-18 NOTE — Patient Instructions (Signed)
Do not arch backward with crutches

## 2012-04-18 NOTE — Progress Notes (Signed)
  Subjective:    Patient ID: [name], female    DOB: 1971/01/03, 41 y.o.   MRN: [id number]  HPI Accidentally ran out of narcotic medicine for 4 days, substituted NSAIDs, nauseated, agitated, 8-9/10 at that time Pain Inventory Average Pain 4 Pain Right Now 7 My pain is sharp, stabbing and aching  In the last 24 hours, has pain interfered with the following? General activity 9 Relation with others 10 Enjoyment of life 9 What TIME of day is your pain at its worst? day, evening and night Sleep (in general) Poor  Pain is worse with: walking, bending, sitting, standing and some activites Pain improves with: rest, heat/ice, medication, TENS and injections Relief from Meds: 7  Mobility walk without assistance ability to climb steps?  no do you drive?  yes  Function disabled: date disabled 4/00 I need assistance with the following:  household duties and shopping  Neuro/Psych weakness numbness trouble walking spasms confusion depression  Prior Studies Any changes since last visit?  no  Physicians involved in your care Any changes since last visit?  no   Family History  Problem Relation Age of Onset  . Alzheimer's disease Mother   . Hypertension Father   . Spinal muscular atrophy Father   . Arthritis Father   . Cancer Father    History   Social History  . Marital Status: Legally Separated    Spouse Name: N/A    Number of Children: N/A  . Years of Education: N/A   Social History Main Topics  . Smoking status: Current Every Day Smoker  . Smokeless tobacco: Never Used  . Alcohol Use: Yes  . Drug Use: None  . Sexually Active: None   Other Topics Concern  . None   Social History Narrative  . None   Past Surgical History  Procedure Date  . Cesarean section   . Breast surgery   . Tubal ligation    Past Medical History  Diagnosis Date  . Seizures   . Thyroid disease   . Allergy   . Depression    BP 110/65  Pulse 93  Resp 14  Ht 5\' 4"   (1.626 m)  Wt 127 lb (57.607 kg)  BMI 21.80 kg/m2  SpO2 100%  LMP 04/10/2012     Review of Systems  Musculoskeletal: Positive for back pain and gait problem.  Neurological: Positive for weakness and numbness.  Psychiatric/Behavioral: Positive for confusion and dysphoric mood.  All other systems reviewed and are negative.       Objective:   Physical Exam  Constitutional: She is oriented to person, place, and time. She appears well-developed and well-nourished.  Musculoskeletal:       Lumbar back: She exhibits decreased range of motion and spasm.  Neurological: She is alert and oriented to person, place, and time. She has normal strength. Gait normal.  Psychiatric: She has a normal mood and affect.          Assessment & Plan:  1.  Lumbar and thoracic spondylosis.  Would recommend a more forward flexed posture for walker post op Cont hydrocodone, call pharmacy for refill RTC 2 weeks

## 2012-04-19 ENCOUNTER — Ambulatory Visit: Payer: BC Managed Care – PPO | Admitting: Physical Medicine & Rehabilitation

## 2012-04-23 ENCOUNTER — Encounter (HOSPITAL_BASED_OUTPATIENT_CLINIC_OR_DEPARTMENT_OTHER): Payer: Self-pay | Admitting: *Deleted

## 2012-04-23 ENCOUNTER — Encounter: Payer: Self-pay | Admitting: Physical Medicine & Rehabilitation

## 2012-04-23 ENCOUNTER — Ambulatory Visit (HOSPITAL_BASED_OUTPATIENT_CLINIC_OR_DEPARTMENT_OTHER): Payer: No Typology Code available for payment source | Admitting: Physical Medicine & Rehabilitation

## 2012-04-23 VITALS — BP 125/65 | HR 86 | Resp 14 | Ht 64.0 in | Wt 123.4 lb

## 2012-04-23 DIAGNOSIS — S335XXA Sprain of ligaments of lumbar spine, initial encounter: Secondary | ICD-10-CM

## 2012-04-23 DIAGNOSIS — M549 Dorsalgia, unspecified: Secondary | ICD-10-CM

## 2012-04-23 DIAGNOSIS — S39012A Strain of muscle, fascia and tendon of lower back, initial encounter: Secondary | ICD-10-CM

## 2012-04-23 MED ORDER — KETOROLAC TROMETHAMINE 30 MG/ML IJ SOLN
30.0000 mg | Freq: Once | INTRAMUSCULAR | Status: AC
  Administered 2012-04-23: 30 mg via INTRAMUSCULAR

## 2012-04-23 MED ORDER — KETOROLAC TROMETHAMINE 30 MG/ML IM SOLN: 30.0000 mg | Freq: Once | INTRAMUSCULAR | Status: DC

## 2012-04-23 NOTE — Progress Notes (Signed)
  Subjective:    Patient ID: [name], female    DOB: 02-23-1971, 41 y.o.   MRN: [id number]  HPI Slipped on the mud fell on her left side has increased back pain No leg pain No numbness in the legs No bowel or bladder dysfunction Pain Inventory Average Pain 5 Pain Right Now 8 My pain is sharp, burning, stabbing and aching  In the last 24 hours, has pain interfered with the following? General activity 7 Relation with others 9 Enjoyment of life 9 What TIME of day is your pain at its worst? daytime, evening and night Sleep (in general) Poor  Pain is worse with: walking, bending, sitting and standing Pain improves with: rest, heat/ice, medication, TENS and injections Relief from Meds: 4  Mobility walk without assistance how many minutes can you walk? not long ability to climb steps?  yes do you drive?  yes  Function disabled: date disabled  I need assistance with the following:  household duties and shopping  Neuro/Psych bowel control problems trouble walking spasms  Prior Studies Any changes since last visit?  no  Physicians involved in your care Any changes since last visit?  no   Family History  Problem Relation Age of Onset  . Alzheimer's disease Mother   . Hypertension Father   . Spinal muscular atrophy Father   . Arthritis Father   . Cancer Father    History   Social History  . Marital Status: Legally Separated    Spouse Name: N/A    Number of Children: N/A  . Years of Education: N/A   Social History Main Topics  . Smoking status: Current Every Day Smoker  . Smokeless tobacco: Never Used  . Alcohol Use: Yes  . Drug Use: None  . Sexually Active: None   Other Topics Concern  . None   Social History Narrative  . None   Past Surgical History  Procedure Date  . Cesarean section   . Breast surgery   . Tubal ligation    Past Medical History  Diagnosis Date  . Seizures   . Thyroid disease   . Allergy   . Depression    BP 125/65   Pulse 86  Resp 14  Ht 5\' 4"  (1.626 m)  Wt 123 lb 6.4 oz (55.974 kg)  BMI 21.18 kg/m2  SpO2 96%  LMP 04/10/2012    Review of Systems  Gastrointestinal: Positive for nausea and constipation.  Musculoskeletal: Positive for back pain and gait problem.  Neurological:       Spasms   All other systems reviewed and are negative.       Objective:   Physical Exam  Musculoskeletal:       Lumbar back: She exhibits decreased range of motion, tenderness, pain and spasm. She exhibits no bony tenderness, no swelling, no edema and no deformity.       Tenderness along the paraspinal muscles of the thoracic and lumbar spine as well as PSIS bilateral. Negative straight leg raising Lower extremity strength is normal           Assessment & Plan:  1.  Lumbar and thoracic spondylosis. Recent fall with lumbar strain and possible sacroiliac contusion. I reassured patient that she did not do any major damage. I do not think an x-ray is needed. We'll continue heat, narcotic analgesics, Toradol 30 mg IM today Has appointment for followup already Discussed postoperative pain medications Cont hydrocodone, call pharmacy for refill

## 2012-04-23 NOTE — Patient Instructions (Signed)
Dr. Eulah Pont may write a prescription for postoperative pain medicine for 2 weeks postoperative I would recommend either oxycodone immediate release 5 mg one tablet 4 times per day Or hydrocodone 5/325 one tablet 4 times per day  You received a shot of Toradol today and this will not interfere with your surgery

## 2012-04-23 NOTE — Progress Notes (Signed)
Pt sees pain clinic-chronic pain Takes norvasc for raynaulds-no htn or cardiac problems

## 2012-04-24 NOTE — H&P (Signed)
  [name]/[facility] [address]   [address] [place], [place] [address] [phone] A Division of [facility]  [name], M.D.     Robert A. Thurston Hole, M.D.     Lunette Stands, M.D. Eulas Post, M.D.    Buford Dresser, M.D. Estell Harpin, M.D. Genene Churn. Barry Dienes, PA-C            Kirstin A. Shepperson, PA-C [place], OPA-C   RE: [name], [name]                                [phone]      DOB: 30-Sep-1970 PROGRESS NOTE: 11-03-11 [name] comes in for follow up.  MRI of her elbow complete.  This is essentially a normal exam.  Although she still has some achiness on the lateral aspect of the right elbow, I think reassurance that her MRI is negative is going to be enough for treatment of that area.  I went over this with her and she understands.   We have also completed an MRI of her continually symptomatic right knee.  This is the same side where she had a patellar dislocation treated closed in the past.  She has had some retropatellar crepitus irritation, but has not had recurrent episodes of instability.  Her main issue has been mechanical symptoms on the medial side.  As a result, proceeded with MRI scan of that knee.  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  Undersurface tear and central root tear posterior horn lateral meniscus.  I have looked at the report and scan and agree with this.    DISPOSITION:  Fully discussed MRI findings in both these areas.  In regards to her elbow, nothing further to be done.    In regards to her knee, she is having significant enough symptoms to warrant proceeding.  This is something we have discussed off and on for the last year.  I initially felt that the referred symptoms down the joint line were coming from the patellofemoral joint, but the MRI showing definitely a tear I think this should be addressed.  I went over this with her and she understands.   Discussed exam under anesthesia, arthroscopy, care of her meniscus tear and chondroplasty patellofemoral joint.  At this point in time she is not having enough patella instability to do anything to re-stabilize that area.  I will look at tracking, adding a lateral release if it looks like that is indicated, but I really doubt that.  We will also look at her medial plica at the time of her arthroscopy.  All questions answered.  Procedures, risks, benefits and complications reviewed in detail.  More than 25 minutes spent covering all of these issues.    She had brought up before leaving continued, significant issues with her back, which are leading her towards potential disability.  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     [name], M.D.   Electronically verified by [name], M.D. DFM:jjh D 11-03-11 T 11-07-11

## 2012-04-24 NOTE — H&P (Signed)
  [facility] [address]   [address] [place], [place] [address] [phone] A Division of [facility]  Loreta Ave, M.D.   Robert A. Thurston Hole, M.D.   Burnell Blanks, M.D.   Eulas Post, M.D.   Lunette Stands, M.D Buford Dresser, M.D.  Charlsie Quest, M.D.   Estell Harpin, M.D.   Melina Fiddler, M.D. Genene Churn. Barry Dienes, PA-C            Kirstin A. Shepperson, PA-C Josh Hoonah, PA-C [place], [place]   RE: Brittany, Vang                                [id number]      DOB: 04/28/1971 PROGRESS NOTE: 03-19-12 [name] is a 41 year-old woman here with right knee pain.  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  Additionally, she notes central root and posterolateral meniscus tear.  She has a surgery date scheduled for December 19th to treat the meniscus.  She has been working with light VMO strengthening exercises for the patellar symptoms and she has not had any significant anterior knee pain, dislocation or subluxation events in the interim.  She feels well overall.  She denies any radiating pain, weakness or numbness.  Additionally, she has been wearing a brace and notes an abrasion on the anterior aspect of her right knee from the brace, but otherwise no injuries, but mild locking and catching.  No giving way or popping.  She does note crepitation on extension.   Past medical history, family history and social history is reviewed and is unchanged.  Review of systems: Please see HPI. Allergies: Unchanged.  Current medications: Unchanged.    EXAMINATION: Height: 5?4.  Weight: 110 pounds.  Blood pressure: 118/74.  Pulse: 80.  In general, well appearing woman in no acute distress.  Right knee abrasion on the anterior tibia several centimeters below the tibial tuberosity.  She has range of motion 0-120 with 1+ patellar  crepitations with normal patellar tracking without patellar apprehension test or patellar grind.  Positive patellar grind test.  Lachman and valgus and varus stress are normal.  Mildly positive McMurray's exam.  Distal neurovascular exam shows normal pulses.  Distal neuro shows normal sensation.  Further exam is documented in the paper chart     X-RAYS: Repeat right knee x-ray shows mild DJD.    IMPRESSION: Right knee lateral meniscus tear and past history of patellar dislocation.  PLAN: Proceed to arranged arthroscopic partial meniscectomy.  Continue physical therapy activities.  Routine skin care for the abrasion on the anterior tibia.  Follow up as needed.    Loreta Ave, M.D. Electronically verified by Loreta Ave, M.D. DFM(ESC):jjh Cc: Dr. Lupe Carney, fax: [phone] D 03-19-12 T 03-20-12

## 2012-04-25 ENCOUNTER — Ambulatory Visit (HOSPITAL_BASED_OUTPATIENT_CLINIC_OR_DEPARTMENT_OTHER): Payer: BC Managed Care – PPO | Admitting: Anesthesiology

## 2012-04-25 ENCOUNTER — Encounter (HOSPITAL_BASED_OUTPATIENT_CLINIC_OR_DEPARTMENT_OTHER): Payer: Self-pay | Admitting: *Deleted

## 2012-04-25 ENCOUNTER — Encounter (HOSPITAL_BASED_OUTPATIENT_CLINIC_OR_DEPARTMENT_OTHER): Payer: Self-pay | Admitting: Anesthesiology

## 2012-04-25 ENCOUNTER — Ambulatory Visit (HOSPITAL_BASED_OUTPATIENT_CLINIC_OR_DEPARTMENT_OTHER)
Admission: RE | Admit: 2012-04-25 | Discharge: 2012-04-25 | Disposition: A | Discharge status: Home / Self Care | Payer: BC Managed Care – PPO | Source: Ambulatory Visit | Attending: Orthopedic Surgery | Admitting: Orthopedic Surgery

## 2012-04-25 ENCOUNTER — Encounter (HOSPITAL_BASED_OUTPATIENT_CLINIC_OR_DEPARTMENT_OTHER)
Admission: RE | Disposition: A | Discharge status: Home / Self Care | Payer: Self-pay | Source: Ambulatory Visit | Attending: Orthopedic Surgery

## 2012-04-25 DIAGNOSIS — F17200 Nicotine dependence, unspecified, uncomplicated: Secondary | ICD-10-CM | POA: Insufficient documentation

## 2012-04-25 DIAGNOSIS — R569 Unspecified convulsions: Secondary | ICD-10-CM | POA: Insufficient documentation

## 2012-04-25 DIAGNOSIS — M23359 Other meniscus derangements, posterior horn of lateral meniscus, unspecified knee: Secondary | ICD-10-CM | POA: Insufficient documentation

## 2012-04-25 DIAGNOSIS — Z9889 Other specified postprocedural states: Secondary | ICD-10-CM

## 2012-04-25 DIAGNOSIS — K219 Gastro-esophageal reflux disease without esophagitis: Secondary | ICD-10-CM | POA: Insufficient documentation

## 2012-04-25 DIAGNOSIS — M2240 Chondromalacia patellae, unspecified knee: Secondary | ICD-10-CM | POA: Insufficient documentation

## 2012-04-25 HISTORY — DX: Other intervertebral disc degeneration, lumbar region: M51.36

## 2012-04-25 HISTORY — PX: KNEE ARTHROSCOPY WITH MEDIAL MENISECTOMY: SHX5651

## 2012-04-25 HISTORY — DX: Gastro-esophageal reflux disease without esophagitis: K21.9

## 2012-04-25 HISTORY — DX: Other intervertebral disc degeneration, lumbar region without mention of lumbar back pain or lower extremity pain: M51.369

## 2012-04-25 HISTORY — DX: Raynaud's syndrome without gangrene: I73.00

## 2012-04-25 HISTORY — DX: Other chronic pain: G89.29

## 2012-04-25 HISTORY — DX: Nausea with vomiting, unspecified: R11.2

## 2012-04-25 HISTORY — DX: Other complications of anesthesia, initial encounter: T88.59XA

## 2012-04-25 HISTORY — DX: Adverse effect of unspecified anesthetic, initial encounter: T41.45XA

## 2012-04-25 HISTORY — DX: Other specified postprocedural states: Z98.890

## 2012-04-25 SURGERY — ARTHROSCOPY, KNEE, WITH MEDIAL MENISCECTOMY
Anesthesia: General | Site: Knee | Laterality: Right | Wound class: Clean

## 2012-04-25 MED ORDER — PROPOFOL INFUSION 10 MG/ML OPTIME
INTRAVENOUS | Status: DC | PRN
  Administered 2012-04-25: 200 ug/kg/min via INTRAVENOUS

## 2012-04-25 MED ORDER — CEFAZOLIN SODIUM-DEXTROSE 2-3 GM-% IV SOLR
2.0000 g | INTRAVENOUS | Status: AC
  Administered 2012-04-25: 2 g via INTRAVENOUS

## 2012-04-25 MED ORDER — DEXAMETHASONE SODIUM PHOSPHATE 4 MG/ML IJ SOLN
INTRAMUSCULAR | Status: DC | PRN
  Administered 2012-04-25: 8 mg via INTRAVENOUS

## 2012-04-25 MED ORDER — LACTATED RINGERS IV SOLN
INTRAVENOUS | Status: DC
  Administered 2012-04-25 (×2): via INTRAVENOUS

## 2012-04-25 MED ORDER — SODIUM CHLORIDE 0.9 % IR SOLN
Status: DC | PRN
  Administered 2012-04-25: 3000 mL

## 2012-04-25 MED ORDER — LIDOCAINE HCL (CARDIAC) 20 MG/ML IV SOLN
INTRAVENOUS | Status: DC | PRN
  Administered 2012-04-25: 50 mg via INTRAVENOUS

## 2012-04-25 MED ORDER — OXYCODONE HCL 5 MG/5ML PO SOLN: 5.0000 mg | Freq: Once | ORAL | Status: DC | PRN

## 2012-04-25 MED ORDER — PROMETHAZINE HCL 25 MG/ML IJ SOLN: 6.2500 mg | Freq: Once | INTRAMUSCULAR | Status: DC

## 2012-04-25 MED ORDER — BUPIVACAINE HCL (PF) 0.25 % IJ SOLN
INTRAMUSCULAR | Status: DC | PRN
  Administered 2012-04-25: 7 mL

## 2012-04-25 MED ORDER — HYDROMORPHONE HCL PF 1 MG/ML IJ SOLN: 0.2500 mg | INTRAMUSCULAR | Status: DC | PRN

## 2012-04-25 MED ORDER — SCOPOLAMINE 1 MG/3DAYS TD PT72
1.0000 | MEDICATED_PATCH | TRANSDERMAL | Status: DC
  Administered 2012-04-25: 1.5 mg via TRANSDERMAL

## 2012-04-25 MED ORDER — KETOROLAC TROMETHAMINE 30 MG/ML IJ SOLN
INTRAMUSCULAR | Status: DC | PRN
  Administered 2012-04-25: 30 mg via INTRAVENOUS

## 2012-04-25 MED ORDER — MIDAZOLAM HCL 5 MG/5ML IJ SOLN
INTRAMUSCULAR | Status: DC | PRN
  Administered 2012-04-25: 4 mg via INTRAVENOUS

## 2012-04-25 MED ORDER — PROPOFOL 10 MG/ML IV BOLUS
INTRAVENOUS | Status: DC | PRN
  Administered 2012-04-25: 200 mg via INTRAVENOUS

## 2012-04-25 MED ORDER — OXYCODONE HCL 5 MG PO TABS: 5.0000 mg | ORAL_TABLET | Freq: Once | ORAL | Status: DC | PRN

## 2012-04-25 MED ORDER — ONDANSETRON HCL 4 MG/2ML IJ SOLN
INTRAMUSCULAR | Status: DC | PRN
  Administered 2012-04-25 (×2): 4 mg via INTRAVENOUS

## 2012-04-25 MED ORDER — ACETAMINOPHEN 10 MG/ML IV SOLN
1000.0000 mg | Freq: Once | INTRAVENOUS | Status: AC
  Administered 2012-04-25: 1000 mg via INTRAVENOUS

## 2012-04-25 SURGICAL SUPPLY — 38 items
BANDAGE ELASTIC 6 VELCRO ST LF (GAUZE/BANDAGES/DRESSINGS) ×2 IMPLANT
BLADE CUDA 5.5 (BLADE) IMPLANT
BLADE CUDA GRT WHITE 3.5 (BLADE) IMPLANT
BLADE CUTTER GATOR 3.5 (BLADE) ×2 IMPLANT
BLADE CUTTER MENIS 5.5 (BLADE) IMPLANT
BLADE GREAT WHITE 4.2 (BLADE) ×2 IMPLANT
BUR OVAL 4.0 (BURR) IMPLANT
CANISTER OMNI JUG 16 LITER (MISCELLANEOUS) ×2 IMPLANT
CANISTER SUCTION 2500CC (MISCELLANEOUS) IMPLANT
CLOTH BEACON ORANGE TIMEOUT ST (SAFETY) ×2 IMPLANT
CUTTER MENISCUS  4.2MM (BLADE)
CUTTER MENISCUS 4.2MM (BLADE) IMPLANT
DRAPE ARTHROSCOPY W/POUCH 90 (DRAPES) ×2 IMPLANT
DURAPREP 26ML APPLICATOR (WOUND CARE) IMPLANT
ELECT MENISCUS 165MM 90D (ELECTRODE) IMPLANT
ELECT REM PT RETURN 9FT ADLT (ELECTROSURGICAL) ×2
ELECTRODE REM PT RTRN 9FT ADLT (ELECTROSURGICAL) IMPLANT
GAUZE XEROFORM 1X8 LF (GAUZE/BANDAGES/DRESSINGS) ×2 IMPLANT
GLOVE BIO SURGEON STRL SZ7 (GLOVE) ×1 IMPLANT
GLOVE BIOGEL PI IND STRL 7.0 (GLOVE) IMPLANT
GLOVE BIOGEL PI IND STRL 8 (GLOVE) ×1 IMPLANT
GLOVE BIOGEL PI INDICATOR 7.0 (GLOVE) ×2
GLOVE BIOGEL PI INDICATOR 8 (GLOVE) ×1
GLOVE ORTHO TXT STRL SZ7.5 (GLOVE) ×4 IMPLANT
GOWN PREVENTION PLUS XLARGE (GOWN DISPOSABLE) ×4 IMPLANT
GOWN STRL REIN 2XL XLG LVL4 (GOWN DISPOSABLE) ×2 IMPLANT
HOLDER KNEE FOAM BLUE (MISCELLANEOUS) ×2 IMPLANT
KNEE WRAP E Z 3 GEL PACK (MISCELLANEOUS) ×1 IMPLANT
PACK ARTHROSCOPY DSU (CUSTOM PROCEDURE TRAY) ×2 IMPLANT
PACK BASIN DAY SURGERY FS (CUSTOM PROCEDURE TRAY) ×2 IMPLANT
PENCIL BUTTON HOLSTER BLD 10FT (ELECTRODE) IMPLANT
SET ARTHROSCOPY TUBING (MISCELLANEOUS) ×2
SET ARTHROSCOPY TUBING LN (MISCELLANEOUS) ×1 IMPLANT
SPONGE GAUZE 4X4 12PLY (GAUZE/BANDAGES/DRESSINGS) ×4 IMPLANT
SUT ETHILON 3 0 PS 1 (SUTURE) ×2 IMPLANT
SUT VIC AB 3-0 FS2 27 (SUTURE) IMPLANT
TOWEL OR 17X24 6PK STRL BLUE (TOWEL DISPOSABLE) ×2 IMPLANT
WATER STERILE IRR 1000ML POUR (IV SOLUTION) ×2 IMPLANT

## 2012-04-25 NOTE — Brief Op Note (Signed)
04/25/2012  8:22 AM  PATIENT:  [name]  41 y.o. female  PRE-OPERATIVE DIAGNOSIS:  RIGHT KNEE TEAR MENISCUS MEDIAL KNEE 836  POST-OPERATIVE DIAGNOSIS:  chondromalacia lateral meniscus tear right knee  PROCEDURE:  Procedure(s) (LRB) with comments: KNEE ARTHROSCOPY WITH MEDIAL MENISECTOMY (Right) - right knee arthroscopy, chondroplasty, debridement of lateral meniscus  SURGEON:  Surgeon(s) and Role:    * Loreta Ave, MD - Primary  PHYSICIAN ASSISTANT: Zonia Kief M     ANESTHESIA:   general  EBL:  Total I/O In: 1100 [I.V.:1100] Out: -   SPECIMEN:  No Specimen  DISPOSITION OF SPECIMEN:  N/A  COUNTS:  YES  TOURNIQUET:  * No tourniquets in log *   PATIENT DISPOSITION:  PACU - hemodynamically stable.

## 2012-04-25 NOTE — Transfer of Care (Signed)
Immediate Anesthesia Transfer of Care Note  Patient: [name]  Procedure(s) Performed: Procedure(s) (LRB) with comments: KNEE ARTHROSCOPY WITH MEDIAL MENISECTOMY (Right) - right knee arthroscopy, chondroplasty, debridement of lateral meniscus  Patient Location: PACU  Anesthesia Type:General  Level of Consciousness: sedated  Airway & Oxygen Therapy: Patient Spontanous Breathing and Patient connected to face mask oxygen  Post-op Assessment: Report given to PACU RN and Post -op Vital signs reviewed and stable  Post vital signs: Reviewed and stable  Complications: No apparent anesthesia complications

## 2012-04-25 NOTE — Anesthesia Postprocedure Evaluation (Signed)
  Anesthesia Post-op Note  Patient: [name]  Procedure(s) Performed: Procedure(s) (LRB) with comments: KNEE ARTHROSCOPY WITH MEDIAL MENISECTOMY (Right) - right knee arthroscopy, chondroplasty, debridement of lateral meniscus  Patient Location: PACU  Anesthesia Type:General  Level of Consciousness: awake, alert  and oriented  Airway and Oxygen Therapy: Patient Spontanous Breathing  Post-op Pain: mild  Post-op Assessment: Post-op Vital signs reviewed, Patient's Cardiovascular Status Stable, Respiratory Function Stable, Patent Airway and No signs of Nausea or vomiting  Post-op Vital Signs: Reviewed and stable  Complications: No apparent anesthesia complications

## 2012-04-25 NOTE — Anesthesia Procedure Notes (Signed)
Procedure Name: LMA Insertion Performed by: Bri Wakeman, [place] Pre-anesthesia Checklist: Patient identified, Timeout performed, Emergency Drugs available, Suction available and Patient being monitored Patient Re-evaluated:Patient Re-evaluated prior to inductionOxygen Delivery Method: Circle system utilized Preoxygenation: Pre-oxygenation with 100% oxygen Intubation Type: IV induction Ventilation: Mask ventilation without difficulty LMA: LMA inserted LMA Size: 3.0 Number of attempts: 1 Placement Confirmation: breath sounds checked- equal and bilateral and positive ETCO2 Tube secured with: Tape Dental Injury: Teeth and Oropharynx as per pre-operative assessment      

## 2012-04-25 NOTE — Interval H&P Note (Signed)
History and Physical Interval Note:  04/25/2012 7:28 AM  [name]  has presented today for surgery, with the diagnosis of RIGHT KNEE TEAR MENISCUS MEDIAL KNEE 836  The various methods of treatment have been discussed with the patient and family. After consideration of risks, benefits and other options for treatment, the patient has consented to  Procedure(s) (LRB) with comments: KNEE ARTHROSCOPY WITH MEDIAL MENISECTOMY (Right) - RIGHT KNEE SCOPE MEDIAL MENISCECTOMY as a surgical intervention .  The patient's history has been reviewed, patient examined, no change in status, stable for surgery.  I have reviewed the patient's chart and labs.  Questions were answered to the patient's satisfaction.     Trypp Heckmann F

## 2012-04-25 NOTE — Anesthesia Preprocedure Evaluation (Signed)
Anesthesia Evaluation  Patient identified by MRN, date of birth, ID band  Reviewed: Allergy & Precautions, H&P , NPO status , Patient's Chart, lab work & pertinent test results  History of Anesthesia Complications (+) PONV  Airway Mallampati: I TM Distance: >3 FB Neck ROM: Full    Dental No notable dental hx. (+) Teeth Intact and Dental Advisory Given   Pulmonary Current Smoker,  breath sounds clear to auscultation  Pulmonary exam normal       Cardiovascular negative cardio ROS  Rhythm:Regular Rate:Normal     Neuro/Psych Seizures -, Well Controlled,  PSYCHIATRIC DISORDERS  Neuromuscular disease    GI/Hepatic Neg liver ROS, GERD-  Medicated and Controlled,  Endo/Other  negative endocrine ROS  Renal/GU negative Renal ROS  negative genitourinary   Musculoskeletal   Abdominal   Peds  Hematology negative hematology ROS (+)   Anesthesia Other Findings   Reproductive/Obstetrics negative OB ROS                           Anesthesia Physical Anesthesia Plan  ASA: II  Anesthesia Plan: General   Post-op Pain Management:    Induction: Intravenous  Airway Management Planned: LMA  Additional Equipment:   Intra-op Plan:   Post-operative Plan: Extubation in OR  Informed Consent: I have reviewed the patients History and Physical, chart, labs and discussed the procedure including the risks, benefits and alternatives for the proposed anesthesia with the patient or authorized representative who has indicated his/her understanding and acceptance.   Dental advisory given  Plan Discussed with: CRNA  Anesthesia Plan Comments:         Anesthesia Quick Evaluation

## 2012-04-26 ENCOUNTER — Telehealth: Payer: Self-pay | Admitting: *Deleted

## 2012-04-26 ENCOUNTER — Encounter (HOSPITAL_BASED_OUTPATIENT_CLINIC_OR_DEPARTMENT_OTHER): Payer: Self-pay | Admitting: Orthopedic Surgery

## 2012-04-26 NOTE — Telephone Encounter (Signed)
Patient needs [place] to call Dr. Greig Right office and clarify orders on which medications Dr. Wynn Banker authorized patient to take after having knee surgery. Patient would like to take Ibuprofen coated 800mg  if possible.

## 2012-04-26 NOTE — Op Note (Deleted)
NAME:  Mcneff, Saryn               ACCOUNT NO.:  [id number]  MEDICAL RECORD NO.:  [id number]  LOCATION:  [facility]                          FACILITY:  [facility]  PHYSICIAN:  Izaak Sahr F. Behr Cislo, M.D. 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  PROCEDURE:  Right knee exam under anesthesia and arthroscopy.  Extensive chondroplasty of patellofemoral joint.  Partial lateral meniscectomy.  SURGEON:  Eryka Dolinger F. Edan Serratore, M.D.  ASSISTANT:  James M. Owens, PA  ANESTHESIA:  General.  BLOOD LOSS:  Minimal.  SPECIMENS:  None.  CULTURES:  None.  COMPLICATION:  None.  DRESSINGS:  Soft compressive.  TOURNIQUET:  Not employed.  PROCEDURE:  The patient was brought to the operating room and placed on the operating table in supine position.  After adequate anesthesia had been obtained, right knee was examined.  Full motion.  Patellofemoral crepitus.  Tracking not bad.  Does not tether.  No real instability. Leg holder was applied.  Leg was prepped and draped in usual sterile fashion.  Two portals, one of each medial and lateral parapatellar. Arthroscope was introduced.  Knee was distended and inspected. Extensive grade 3 changes, peak of the patella, medial facet.  Some of these getting close to grade 4.  Chondroplasty to a stable surface.  The lateral facet, the trochlea otherwise looked good.  Chondral debris throughout debrided.  Tracks little laterally, but really does not tether laterally.  Again, not significant instability despite her previous dislocation.  Flap tear of posterior horn and lateral meniscus was removed, tapered in smoothly.  Remaining lateral compartment and lateral meniscus, medial compartment and medial  meniscus, cruciate ligaments were normal.  Entire knee examined to be sure.  All chondral fragments were removed.  Instruments and fluid were removed.  Portals were closed with nylon.  Knee was injected with Depo-Medrol and Marcaine.  A sterile compressive dressing was applied.  Anesthesia was reversed.  Brought to the recovery room. Tolerated the surgery well.  No complications.     Hawley Michel F. Tiwanda Threats, M.D.     DFM/MEDQ  D:  04/25/2012  T:  04/26/2012  Job:  [id number] 

## 2012-04-26 NOTE — Telephone Encounter (Signed)
Spoke with Dr Helaine Chess office.  They will follow upon ibuprofen request.

## 2012-04-26 NOTE — Op Note (Signed)
NAMEROSALI, AUGELLO NO.:  [id number]  MEDICAL RECORD NO.:  [id number]  LOCATION:  [facility]                          FACILITY:  [facility]  PHYSICIAN:  Loreta Ave, M.D. 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  PROCEDURE:  Right knee exam under anesthesia and arthroscopy.  Extensive chondroplasty of patellofemoral joint.  Partial lateral meniscectomy.  SURGEON:  Loreta Ave, M.D.  ASSISTANT:  Genene Churn. Barry Dienes, [place]  ANESTHESIA:  General.  BLOOD LOSS:  Minimal.  SPECIMENS:  None.  CULTURES:  None.  COMPLICATION:  None.  DRESSINGS:  Soft compressive.  TOURNIQUET:  Not employed.  PROCEDURE:  The patient was brought to the operating room and placed on the operating table in supine position.  After adequate anesthesia had been obtained, right knee was examined.  Full motion.  Patellofemoral crepitus.  Tracking not bad.  Does not tether.  No real instability. Leg holder was applied.  Leg was prepped and draped in usual sterile fashion.  Two portals, one of each medial and lateral parapatellar. Arthroscope was introduced.  Knee was distended and inspected. Extensive grade 3 changes, peak of the patella, medial facet.  Some of these getting close to grade 4.  Chondroplasty to a stable surface.  The lateral facet, the trochlea otherwise looked good.  Chondral debris throughout debrided.  Tracks little laterally, but really does not tether laterally.  Again, not significant instability despite her previous dislocation.  Flap tear of posterior horn and lateral meniscus was removed, tapered in smoothly.  Remaining lateral compartment and lateral meniscus, medial compartment and medial  meniscus, cruciate ligaments were normal.  Entire knee examined to be sure.  All chondral fragments were removed.  Instruments and fluid were removed.  Portals were closed with nylon.  Knee was injected with Depo-Medrol and Marcaine.  A sterile compressive dressing was applied.  Anesthesia was reversed.  Brought to the recovery room. Tolerated the surgery well.  No complications.     Loreta Ave, M.D.     DFM/MEDQ  D:  04/25/2012  T:  04/26/2012  Job:  [id number]

## 2012-05-03 ENCOUNTER — Telehealth: Payer: Self-pay

## 2012-05-03 MED ORDER — HYDROCODONE-ACETAMINOPHEN 10-325 MG PO TABS: 1.0000 | ORAL_TABLET | Freq: Four times a day (QID) | ORAL | Status: DC | PRN

## 2012-05-03 MED ORDER — DIAZEPAM 10 MG PO TABS: 10.0000 mg | ORAL_TABLET | Freq: Three times a day (TID) | ORAL | Status: DC | PRN

## 2012-05-03 MED ORDER — ZOLPIDEM TARTRATE 10 MG PO TABS: 10.0000 mg | ORAL_TABLET | Freq: Every evening | ORAL | Status: DC | PRN

## 2012-05-03 NOTE — Telephone Encounter (Signed)
Patient called requesting refills on hydrocodone, Ambien, and valium.  These were called into [facility].  Patient aware.

## 2012-05-14 ENCOUNTER — Encounter (INDEPENDENT_AMBULATORY_CARE_PROVIDER_SITE_OTHER): Payer: BC Managed Care – PPO

## 2012-05-14 DIAGNOSIS — M79609 Pain in unspecified limb: Secondary | ICD-10-CM

## 2012-05-14 DIAGNOSIS — R609 Edema, unspecified: Secondary | ICD-10-CM

## 2012-05-14 DIAGNOSIS — M7989 Other specified soft tissue disorders: Secondary | ICD-10-CM

## 2012-05-24 ENCOUNTER — Encounter: Payer: BC Managed Care – PPO | Attending: Physical Medicine & Rehabilitation

## 2012-05-24 ENCOUNTER — Encounter: Payer: Self-pay | Admitting: Physical Medicine & Rehabilitation

## 2012-05-24 ENCOUNTER — Ambulatory Visit (HOSPITAL_BASED_OUTPATIENT_CLINIC_OR_DEPARTMENT_OTHER): Payer: No Typology Code available for payment source | Admitting: Physical Medicine & Rehabilitation

## 2012-05-24 VITALS — BP 125/56 | HR 96 | Resp 14 | Ht 64.0 in | Wt 123.2 lb

## 2012-05-24 DIAGNOSIS — M7918 Myalgia, other site: Secondary | ICD-10-CM

## 2012-05-24 DIAGNOSIS — IMO0001 Reserved for inherently not codable concepts without codable children: Secondary | ICD-10-CM

## 2012-05-24 DIAGNOSIS — G894 Chronic pain syndrome: Secondary | ICD-10-CM | POA: Insufficient documentation

## 2012-05-24 DIAGNOSIS — M545 Low back pain, unspecified: Secondary | ICD-10-CM | POA: Insufficient documentation

## 2012-05-24 DIAGNOSIS — M47816 Spondylosis without myelopathy or radiculopathy, lumbar region: Secondary | ICD-10-CM

## 2012-05-24 DIAGNOSIS — M549 Dorsalgia, unspecified: Secondary | ICD-10-CM

## 2012-05-24 DIAGNOSIS — M5137 Other intervertebral disc degeneration, lumbosacral region: Secondary | ICD-10-CM

## 2012-05-24 DIAGNOSIS — M5380 Other specified dorsopathies, site unspecified: Secondary | ICD-10-CM

## 2012-05-24 DIAGNOSIS — M546 Pain in thoracic spine: Secondary | ICD-10-CM | POA: Insufficient documentation

## 2012-05-24 DIAGNOSIS — M5136 Other intervertebral disc degeneration, lumbar region: Secondary | ICD-10-CM

## 2012-05-24 NOTE — Progress Notes (Signed)
Subjective:    Patient ID: [name], female    DOB: [birth year], 42 y.o.   MRN: [id number]  HPI Underwent right arthroscopic knee surgery with Dr. Eulah Pont 04/25/2012. No postoperative complication. Made it through on her usual pain medications supplemented by some postop meds from Dr. Eulah Pont. Back to her usual regimen. Ambulating without assisted device. Has resumed usual activities. Pain Inventory Average Pain 4 Pain Right Now 7 My pain is sharp, burning, tingling and aching  In the last 24 hours, has pain interfered with the following? General activity 7 Relation with others 9 Enjoyment of life 0 What TIME of day is your pain at its worst? evening and night Sleep (in general) Fair  Pain is worse with: walking, bending, sitting, inactivity and standing Pain improves with: rest, heat/ice, medication, TENS and injections Relief from Meds: 7  Mobility ability to climb steps?  yes do you drive?  yes  Function disabled: date disabled  I need assistance with the following:  meal prep, household duties and shopping  Neuro/Psych weakness spasms confusion depression anxiety  Prior Studies Any changes since last visit?  no  Physicians involved in your care Any changes since last visit?  no   Family History  Problem Relation Age of Onset  . Alzheimer's disease Mother   . Hypertension Father   . Spinal muscular atrophy Father   . Arthritis Father   . Cancer Father    History   Social History  . Marital Status: Legally Separated    Spouse Name: N/A    Number of Children: N/A  . Years of Education: N/A   Social History Main Topics  . Smoking status: Current Every Day Smoker  . Smokeless tobacco: Never Used  . Alcohol Use: Yes  . Drug Use: No  . Sexually Active: None     Comment: trying to quit   Other Topics Concern  . None   Social History Narrative  . None   Past Surgical History  Procedure Date  . Cesarean section   . Breast surgery    breast augmentation  . Tubal ligation     age 17-accidental wrong pt-  . Laparoscopic tubal ligation     reversal  . Cesarean section     x6  . Knee arthroscopy with medial menisectomy 04/25/2012    Procedure: KNEE ARTHROSCOPY WITH MEDIAL MENISECTOMY;  Surgeon: Loreta Ave, MD;  Location: [facility];  Service: Orthopedics;  Laterality: Right;  right knee arthroscopy, chondroplasty, debridement of lateral meniscus   Past Medical History  Diagnosis Date  . Thyroid disease   . Allergy   . Depression   . Seizures     says she was under alot of stress-had one seizure  about 2008  . DDD (degenerative disc disease), lumbar   . GERD (gastroesophageal reflux disease)   . Chronic pain   . Complication of anesthesia     get very severe n/v-needs scop patch  . PONV (postoperative nausea and vomiting)     severe-needs scop patch-gets sick even with that  . Raynaud's disease    BP 125/56  Pulse 96  Resp 14  Ht 5\' 4"  (1.626 m)  Wt 123 lb 3.2 oz (55.883 kg)  BMI 21.15 kg/m2  SpO2 100%    Review of Systems  Constitutional: Positive for activity change.  Gastrointestinal: Positive for abdominal pain.  Musculoskeletal: Positive for back pain and gait problem.  Neurological: Positive for weakness.  Psychiatric/Behavioral: Positive for confusion and  dysphoric mood. The patient is nervous/anxious.   All other systems reviewed and are negative.       Objective:   Physical Exam  Constitutional: She is oriented to person, place, and time. She appears well-developed and well-nourished.  HENT:  Head: Normocephalic and atraumatic.  Neck: Normal range of motion.  Musculoskeletal:       Lumbar back: She exhibits tenderness and spasm. She exhibits no swelling and no deformity.       Pain with lumbar extension. Full lumbar flexion without pain  Neurological: She is alert and oriented to person, place, and time. She has normal strength. No sensory deficit. She exhibits  normal muscle tone. Gait normal.       Mild atrophy right quadriceps  Psychiatric: Her speech is normal. Judgment and thought content normal. Her mood appears anxious. She is is hyperactive. Cognition and memory are normal.          Assessment & Plan:  1. Lumbar facet syndrome as well as thoracic facet syndrome stable. 2. Postop right knee arthroscopic surgery following with orthopedics 3. Myofascial pain syndrome continue stretching and exercise

## 2012-06-13 ENCOUNTER — Other Ambulatory Visit: Payer: Self-pay | Admitting: Physical Medicine & Rehabilitation

## 2012-06-14 ENCOUNTER — Other Ambulatory Visit: Payer: Self-pay

## 2012-06-14 MED ORDER — HYDROCODONE-ACETAMINOPHEN 10-325 MG PO TABS: 1.0000 | ORAL_TABLET | Freq: Four times a day (QID) | ORAL | Status: DC | PRN

## 2012-06-14 MED ORDER — ZOLPIDEM TARTRATE 10 MG PO TABS: 10.0000 mg | ORAL_TABLET | Freq: Every evening | ORAL | Status: DC | PRN

## 2012-06-14 MED ORDER — DIAZEPAM 10 MG PO TABS: 10.0000 mg | ORAL_TABLET | Freq: Every evening | ORAL | Status: DC | PRN

## 2012-06-18 ENCOUNTER — Telehealth: Payer: Self-pay | Admitting: *Deleted

## 2012-06-18 MED ORDER — DIAZEPAM 10 MG PO TABS: 10.0000 mg | ORAL_TABLET | Freq: Three times a day (TID) | ORAL | Status: DC | PRN

## 2012-06-18 NOTE — Telephone Encounter (Signed)
[name] called to inform us diazepam had been ordered incorrectly.  It was corrected with [facility] to read 10mg  q 8 hr prn #90 no RF instead of 1 q hs prn #90.

## 2012-07-16 ENCOUNTER — Other Ambulatory Visit: Payer: Self-pay | Admitting: Physical Medicine & Rehabilitation

## 2012-07-19 ENCOUNTER — Other Ambulatory Visit: Payer: Self-pay | Admitting: *Deleted

## 2012-07-19 MED ORDER — ZOLPIDEM TARTRATE 10 MG PO TABS: 10.0000 mg | ORAL_TABLET | Freq: Every evening | ORAL | Status: DC | PRN

## 2012-07-19 NOTE — Telephone Encounter (Signed)
Ambien refilled per fax request. Has appt scheduled for 08/20/12

## 2012-08-20 ENCOUNTER — Encounter: Payer: Self-pay | Admitting: Physical Medicine & Rehabilitation

## 2012-08-20 ENCOUNTER — Encounter: Payer: BC Managed Care – PPO | Attending: Physical Medicine & Rehabilitation

## 2012-08-20 ENCOUNTER — Ambulatory Visit (HOSPITAL_BASED_OUTPATIENT_CLINIC_OR_DEPARTMENT_OTHER): Payer: No Typology Code available for payment source | Admitting: Physical Medicine & Rehabilitation

## 2012-08-20 VITALS — BP 120/56 | HR 106 | Resp 14 | Ht 64.0 in | Wt 120.0 lb

## 2012-08-20 DIAGNOSIS — M545 Low back pain, unspecified: Secondary | ICD-10-CM | POA: Insufficient documentation

## 2012-08-20 DIAGNOSIS — K219 Gastro-esophageal reflux disease without esophagitis: Secondary | ICD-10-CM | POA: Insufficient documentation

## 2012-08-20 DIAGNOSIS — M5380 Other specified dorsopathies, site unspecified: Secondary | ICD-10-CM

## 2012-08-20 DIAGNOSIS — Z5181 Encounter for therapeutic drug level monitoring: Secondary | ICD-10-CM

## 2012-08-20 DIAGNOSIS — Z79899 Other long term (current) drug therapy: Secondary | ICD-10-CM

## 2012-08-20 DIAGNOSIS — M5137 Other intervertebral disc degeneration, lumbosacral region: Secondary | ICD-10-CM

## 2012-08-20 DIAGNOSIS — M5136 Other intervertebral disc degeneration, lumbar region: Secondary | ICD-10-CM

## 2012-08-20 DIAGNOSIS — F17200 Nicotine dependence, unspecified, uncomplicated: Secondary | ICD-10-CM | POA: Insufficient documentation

## 2012-08-20 DIAGNOSIS — M47816 Spondylosis without myelopathy or radiculopathy, lumbar region: Secondary | ICD-10-CM

## 2012-08-20 MED ORDER — HYDROCODONE-ACETAMINOPHEN 10-325 MG PO TABS: ORAL_TABLET | ORAL | Status: DC

## 2012-08-20 MED ORDER — ZOLPIDEM TARTRATE 10 MG PO TABS: 10.0000 mg | ORAL_TABLET | Freq: Every evening | ORAL | Status: DC | PRN

## 2012-08-20 MED ORDER — IBUPROFEN 600 MG PO TABS: 600.0000 mg | ORAL_TABLET | Freq: Three times a day (TID) | ORAL | Status: DC | PRN

## 2012-08-20 NOTE — Patient Instructions (Signed)
Bilateral Medial branch blocks next visit  Dr. Alvester Morin is the Dr. At [facility], will set up next time

## 2012-08-20 NOTE — Progress Notes (Addendum)
Subjective:    Patient ID: [name], female    DOB: 1970-10-25, 41 y.o.   MRN: [id number]  HPI Underwent right arthroscopic knee surgery with Dr. Eulah Pont 04/25/2012. No postoperative complication. Made it through on her usual pain medications supplemented by some postop meds from Dr. Eulah Pont.  Back to her usual regimen.  Ambulating without assisted device. Has resumed usual activities. Back to usual activities R knee doing better Low back pain increasing again MBB Bilat L3-4-5 last done Oct 2013 Also interested in thoracic MBB Pain Inventory Average Pain 5 Pain Right Now 8 My pain is sharp, dull and aching  In the last 24 hours, has pain interfered with the following? General activity 7 Relation with others 9 Enjoyment of life 7 What TIME of day is your pain at its worst? daytime and evening and night Sleep (in general) Poor  Pain is worse with: walking, bending, sitting and standing Pain improves with: rest, heat/ice, medication, TENS and injections Relief from Meds: 5  Mobility walk without assistance how many minutes can you walk? 30  Function disabled: date disabled 08/08/1998  Neuro/Psych bladder control problems weakness trouble walking spasms depression anxiety  Prior Studies Any changes since last visit?  no  Physicians involved in your care Any changes since last visit?  no   Family History  Problem Relation Age of Onset  . Alzheimer's disease Mother   . Hypertension Father   . Spinal muscular atrophy Father   . Arthritis Father   . Cancer Father    History   Social History  . Marital Status: Legally Separated    Spouse Name: N/A    Number of Children: N/A  . Years of Education: N/A   Social History Main Topics  . Smoking status: Current Every Day Smoker  . Smokeless tobacco: Never Used  . Alcohol Use: Yes  . Drug Use: No  . Sexually Active: None     Comment: trying to quit   Other Topics Concern  . None   Social History  Narrative  . None   Past Surgical History  Procedure Laterality Date  . Cesarean section    . Breast surgery      breast augmentation  . Tubal ligation      age 57-accidental wrong pt-  . Laparoscopic tubal ligation      reversal  . Cesarean section      x6  . Knee arthroscopy with medial menisectomy  04/25/2012    Procedure: KNEE ARTHROSCOPY WITH MEDIAL MENISECTOMY;  Surgeon: Loreta Ave, MD;  [facility];  Service: Orthopedics;  Laterality: Right;  right knee arthroscopy, chondroplasty, debridement of lateral meniscus   Past Medical History  Diagnosis Date  . Thyroid disease   . Allergy   . Depression   . Seizures     says she was under alot of stress-had one seizure  about 2008  . DDD (degenerative disc disease), lumbar   . GERD (gastroesophageal reflux disease)   . Chronic pain   . Complication of anesthesia     get very severe n/v-needs scop patch  . PONV (postoperative nausea and vomiting)     severe-needs scop patch-gets sick even with that  . Raynaud's disease    BP 120/56  Pulse 106  Resp 14  Ht 5\' 4"  (1.626 m)  Wt 120 lb (54.432 kg)  BMI 20.59 kg/m2  SpO2 99%    Review of Systems  Genitourinary:  Bladder control  Musculoskeletal: Positive for back pain and gait problem.       Spasms  Neurological: Positive for weakness.  Psychiatric/Behavioral: Positive for dysphoric mood. The patient is nervous/anxious.   All other systems reviewed and are negative.       Objective:   Physical Exam  Physical Exam  Constitutional: She is oriented to person, place, and time. She appears well-developed and well-nourished.  HENT:  Head: Normocephalic and atraumatic.  Neck: Normal range of motion.  Musculoskeletal:  Lumbar back: She exhibits tenderness and spasm.Tenderness is in lumbar paraspinals and thoracic paraspinals She exhibits no swelling and no deformity.  Pain with lumbar extension. Full lumbar flexion without pain   Neurological: She is alert and oriented to person, place, and time. She has normal strength. No sensory deficit. She exhibits normal muscle tone. Gait normal.  no atrophy right quadriceps  Psychiatric: Her speech is normal. Judgment and thought content normal. Her mood appears anxious. . Cognition and memory are normal     Assessment & Plan:  1. Lumbar facet syndrome as well as thoracic facet syndrome stable. Is developing increased pain once again.  Has been about 6 mo since lumbar MBB, can repeat Will reassess after lumbar injections and consider thoracic MBB or RF referral. 2. Postop right knee arthroscopic surgery following with orthopedics  3. Myofascial pain syndrome continue stretching and exercise Over half of the 25 min visit was spent counseling and coordinating care.

## 2012-08-20 NOTE — Addendum Note (Signed)
Addended by: [name] on: 08/20/2012 01:34 PM   Modules accepted: Orders

## 2012-08-26 ENCOUNTER — Telehealth: Payer: Self-pay

## 2012-08-27 NOTE — Telephone Encounter (Signed)
Erroneous encounter

## 2012-09-10 ENCOUNTER — Ambulatory Visit (HOSPITAL_BASED_OUTPATIENT_CLINIC_OR_DEPARTMENT_OTHER): Payer: BC Managed Care – PPO | Admitting: Physical Medicine & Rehabilitation

## 2012-09-10 ENCOUNTER — Encounter: Payer: BC Managed Care – PPO | Attending: Physical Medicine & Rehabilitation

## 2012-09-10 ENCOUNTER — Encounter: Payer: Self-pay | Admitting: Physical Medicine & Rehabilitation

## 2012-09-10 VITALS — BP 148/69 | HR 115 | Resp 14 | Ht 64.0 in | Wt 119.4 lb

## 2012-09-10 DIAGNOSIS — M5380 Other specified dorsopathies, site unspecified: Secondary | ICD-10-CM

## 2012-09-10 DIAGNOSIS — M47816 Spondylosis without myelopathy or radiculopathy, lumbar region: Secondary | ICD-10-CM

## 2012-09-10 DIAGNOSIS — K219 Gastro-esophageal reflux disease without esophagitis: Secondary | ICD-10-CM | POA: Insufficient documentation

## 2012-09-10 DIAGNOSIS — M545 Low back pain, unspecified: Secondary | ICD-10-CM | POA: Insufficient documentation

## 2012-09-10 DIAGNOSIS — F17200 Nicotine dependence, unspecified, uncomplicated: Secondary | ICD-10-CM | POA: Insufficient documentation

## 2012-09-10 NOTE — Progress Notes (Signed)
  PROCEDURE RECORD [facility]   Name: [name] DOB:[birth year] MRN: [id number]  Date:09/10/2012  Physician: Claudette Laws, MD    Nurse/CMA: Geovanna Simko RN Allergies:  Allergies  Allergen Reactions  . Betadine (Povidone Iodine)     Thyroid issues  . Bee Venom   . Morphine And Related   . Reglan (Metoclopramide)   . Trazodone And Nefazodone     Consent Signed: yes  Is patient diabetic? no  CBG today?   Pregnant: no LMP: Patient's last menstrual period was 09/07/2012. (age 60-54)  Anticoagulants: no Anti-inflammatory: no Antibiotics: no  Procedure: Medial Branch Block Bilateral Position: Prone Start Time: 3:28 End Time: 3:41 Fluoro Time: 53 seconds  RN/CMA Montez Cuda RN ShumakerRn    Time 14:48 3:47    BP 148/69 132/73    Pulse 115 105    Respirations 14 14    O2 Sat 100 93    S/S 6 6    Pain Level 6/10      D/C home with son in law, patient A & O X 3, D/C instructions reviewed, and sits independently.

## 2012-09-10 NOTE — Patient Instructions (Signed)
Call sooner if need be for appt

## 2012-09-10 NOTE — Progress Notes (Signed)
Bilateral Lumbar , L4  medial branch blocks and L 5 dorsal ramus injection under fluoroscopic guidance  Indication: Lumbar pain which is not relieved by medication management or other conservative care and interfering with self-care and mobility.Last procedure performed 02/29/2012. She's had several months of relief with this procedure  Informed consent was obtained after describing risks and benefits of the procedure with the patient, this includes bleeding, infection, paralysis and medication side effects.  The patient wishes to proceed and has given written consent.  The patient was placed in prone position.  The lumbar area was marked and prepped with Betadine.  One mL of 1% lidocaine was injected into each of 6 areas into the skin and subcutaneous tissue.  Then a 22-gauge 3.5 spinal needle was inserted targeting the junction of the left S1 superior articular process and sacral ala junction. Needle was advanced under fluoroscopic guidance.  Bone contact was made.  Omnipaque 180 was injected x 0.5 mL demonstrating no intravascular uptake.  Then a solution containing one mL of 4 mg per mL dexamethasone and 3 mL of 2% MPF lidocaine was injected x 0.5 mL.  Then the left L5 superior articular process in transverse process junction was targeted.  Bone contact was made.  Omnipaque 180 was injected x 0.5 mL demonstrating no intravascular uptake. Then a solution containing one mL of 4 mg per mL dexamethasone and 3 mL of 2% MPF lidocaine was injected x 0.5 mL. .  This same procedure was performed on the right side using the same needle, technique and injectate.  Patient tolerated procedure well.  Post procedure instructions were given.

## 2012-09-23 ENCOUNTER — Other Ambulatory Visit: Payer: Self-pay | Admitting: *Deleted

## 2012-09-23 MED ORDER — HYDROCODONE-ACETAMINOPHEN 10-325 MG PO TABS: ORAL_TABLET | ORAL | Status: DC

## 2012-09-23 NOTE — Telephone Encounter (Signed)
Hydrocodone refilled per fax request. 

## 2012-09-27 ENCOUNTER — Other Ambulatory Visit: Payer: Self-pay

## 2012-09-27 MED ORDER — HYDROCODONE-ACETAMINOPHEN 10-325 MG PO TABS: ORAL_TABLET | ORAL | Status: DC

## 2012-09-27 MED ORDER — ZOLPIDEM TARTRATE 10 MG PO TABS: 10.0000 mg | ORAL_TABLET | Freq: Every evening | ORAL | Status: DC | PRN

## 2012-10-22 ENCOUNTER — Encounter: Payer: BC Managed Care – PPO | Attending: Physical Medicine & Rehabilitation

## 2012-10-22 ENCOUNTER — Ambulatory Visit (HOSPITAL_BASED_OUTPATIENT_CLINIC_OR_DEPARTMENT_OTHER): Payer: No Typology Code available for payment source | Admitting: Physical Medicine & Rehabilitation

## 2012-10-22 ENCOUNTER — Encounter: Payer: Self-pay | Admitting: Physical Medicine & Rehabilitation

## 2012-10-22 VITALS — BP 121/63 | HR 99 | Resp 14 | Ht 65.0 in | Wt 125.6 lb

## 2012-10-22 DIAGNOSIS — M5380 Other specified dorsopathies, site unspecified: Secondary | ICD-10-CM | POA: Insufficient documentation

## 2012-10-22 DIAGNOSIS — M47816 Spondylosis without myelopathy or radiculopathy, lumbar region: Secondary | ICD-10-CM

## 2012-10-22 DIAGNOSIS — M5136 Other intervertebral disc degeneration, lumbar region: Secondary | ICD-10-CM

## 2012-10-22 DIAGNOSIS — M5137 Other intervertebral disc degeneration, lumbosacral region: Secondary | ICD-10-CM

## 2012-10-22 DIAGNOSIS — M545 Low back pain, unspecified: Secondary | ICD-10-CM | POA: Insufficient documentation

## 2012-10-22 DIAGNOSIS — M7918 Myalgia, other site: Secondary | ICD-10-CM

## 2012-10-22 DIAGNOSIS — K219 Gastro-esophageal reflux disease without esophagitis: Secondary | ICD-10-CM | POA: Insufficient documentation

## 2012-10-22 DIAGNOSIS — F17200 Nicotine dependence, unspecified, uncomplicated: Secondary | ICD-10-CM | POA: Insufficient documentation

## 2012-10-22 DIAGNOSIS — IMO0001 Reserved for inherently not codable concepts without codable children: Secondary | ICD-10-CM

## 2012-10-22 MED ORDER — DIAZEPAM 10 MG PO TABS: 10.0000 mg | ORAL_TABLET | Freq: Three times a day (TID) | ORAL | Status: DC | PRN

## 2012-10-22 NOTE — Patient Instructions (Signed)
Procedure notes from [facility]
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
## 2012-10-24 ENCOUNTER — Telehealth: Payer: Self-pay

## 2012-10-24 MED ORDER — DIAZEPAM 10 MG PO TABS: 10.0000 mg | ORAL_TABLET | Freq: Three times a day (TID) | ORAL | Status: DC | PRN

## 2012-10-24 MED ORDER — HYDROCODONE-ACETAMINOPHEN 10-325 MG PO TABS: ORAL_TABLET | ORAL | Status: DC

## 2012-10-24 NOTE — Telephone Encounter (Signed)
Hydrocodone and diazepam called into [facility].

## 2012-10-31 ENCOUNTER — Other Ambulatory Visit: Payer: Self-pay

## 2012-10-31 MED ORDER — ZOLPIDEM TARTRATE 10 MG PO TABS: 10.0000 mg | ORAL_TABLET | Freq: Every evening | ORAL | Status: DC | PRN

## 2012-11-22 ENCOUNTER — Other Ambulatory Visit: Payer: Self-pay

## 2012-11-22 MED ORDER — HYDROCODONE-ACETAMINOPHEN 10-325 MG PO TABS: ORAL_TABLET | ORAL | Status: DC

## 2013-01-13 ENCOUNTER — Other Ambulatory Visit: Payer: Self-pay

## 2013-01-13 MED ORDER — CYCLOBENZAPRINE HCL 10 MG PO TABS: 10.0000 mg | ORAL_TABLET | Freq: Four times a day (QID) | ORAL | Status: DC

## 2013-01-21 ENCOUNTER — Ambulatory Visit: Payer: BC Managed Care – PPO | Admitting: Physical Medicine & Rehabilitation

## 2013-01-21 ENCOUNTER — Encounter: Payer: No Typology Code available for payment source | Attending: Physical Medicine & Rehabilitation

## 2013-01-21 DIAGNOSIS — M5380 Other specified dorsopathies, site unspecified: Secondary | ICD-10-CM | POA: Insufficient documentation

## 2013-01-21 DIAGNOSIS — M545 Low back pain, unspecified: Secondary | ICD-10-CM | POA: Insufficient documentation

## 2013-01-21 DIAGNOSIS — K219 Gastro-esophageal reflux disease without esophagitis: Secondary | ICD-10-CM | POA: Insufficient documentation

## 2013-01-21 DIAGNOSIS — F17200 Nicotine dependence, unspecified, uncomplicated: Secondary | ICD-10-CM | POA: Insufficient documentation

## 2013-02-06 ENCOUNTER — Ambulatory Visit (HOSPITAL_BASED_OUTPATIENT_CLINIC_OR_DEPARTMENT_OTHER): Payer: No Typology Code available for payment source | Admitting: Physical Medicine & Rehabilitation

## 2013-02-06 ENCOUNTER — Encounter: Payer: No Typology Code available for payment source | Attending: Physical Medicine & Rehabilitation

## 2013-02-06 ENCOUNTER — Encounter: Payer: Self-pay | Admitting: Physical Medicine & Rehabilitation

## 2013-02-06 VITALS — BP 121/66 | HR 90 | Resp 14 | Ht 66.0 in | Wt 121.6 lb

## 2013-02-06 DIAGNOSIS — M549 Dorsalgia, unspecified: Secondary | ICD-10-CM

## 2013-02-06 DIAGNOSIS — M545 Low back pain, unspecified: Secondary | ICD-10-CM | POA: Insufficient documentation

## 2013-02-06 DIAGNOSIS — M5380 Other specified dorsopathies, site unspecified: Secondary | ICD-10-CM | POA: Diagnosis present

## 2013-02-06 DIAGNOSIS — Z5181 Encounter for therapeutic drug level monitoring: Secondary | ICD-10-CM

## 2013-02-06 DIAGNOSIS — M7918 Myalgia, other site: Secondary | ICD-10-CM

## 2013-02-06 DIAGNOSIS — K219 Gastro-esophageal reflux disease without esophagitis: Secondary | ICD-10-CM | POA: Insufficient documentation

## 2013-02-06 DIAGNOSIS — IMO0001 Reserved for inherently not codable concepts without codable children: Secondary | ICD-10-CM

## 2013-02-06 DIAGNOSIS — Z79899 Other long term (current) drug therapy: Secondary | ICD-10-CM

## 2013-02-06 DIAGNOSIS — F17200 Nicotine dependence, unspecified, uncomplicated: Secondary | ICD-10-CM | POA: Diagnosis not present

## 2013-02-06 MED ORDER — METHOCARBAMOL 500 MG PO TABS: 500.0000 mg | ORAL_TABLET | Freq: Three times a day (TID) | ORAL | Status: DC

## 2013-02-06 MED ORDER — HYDROCODONE-ACETAMINOPHEN 10-325 MG PO TABS: ORAL_TABLET | ORAL | Status: DC

## 2013-02-06 NOTE — Patient Instructions (Signed)
Letter for court written today
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
## 2013-02-10 ENCOUNTER — Other Ambulatory Visit: Payer: Self-pay | Admitting: *Deleted

## 2013-02-10 MED ORDER — DIAZEPAM 10 MG PO TABS: 10.0000 mg | ORAL_TABLET | Freq: Three times a day (TID) | ORAL | Status: DC | PRN

## 2013-02-19 ENCOUNTER — Telehealth: Payer: Self-pay

## 2013-02-19 NOTE — Telephone Encounter (Signed)
Patient aware of office policy and drinking alcohol.

## 2013-02-19 NOTE — Telephone Encounter (Signed)
Message copied by [name] on Wed Feb 19, 2013 10:40 AM ------      Message from: [name]      Created: Mon Feb 17, 2013  3:42 PM       Please educate patient about risks and consequences if this happens again ------

## 2013-02-19 NOTE — Telephone Encounter (Signed)
Left message for patient to call office regarding urine drug screen.

## 2013-02-20 ENCOUNTER — Encounter: Payer: Self-pay | Admitting: Physical Medicine & Rehabilitation

## 2013-02-20 ENCOUNTER — Ambulatory Visit (HOSPITAL_BASED_OUTPATIENT_CLINIC_OR_DEPARTMENT_OTHER): Payer: No Typology Code available for payment source | Admitting: Physical Medicine & Rehabilitation

## 2013-02-20 VITALS — BP 125/52 | HR 103 | Resp 14 | Ht 64.0 in | Wt 115.4 lb

## 2013-02-20 DIAGNOSIS — M5380 Other specified dorsopathies, site unspecified: Secondary | ICD-10-CM

## 2013-02-20 DIAGNOSIS — M47816 Spondylosis without myelopathy or radiculopathy, lumbar region: Secondary | ICD-10-CM

## 2013-02-20 MED ORDER — HYDROCODONE-ACETAMINOPHEN 10-325 MG PO TABS: ORAL_TABLET | ORAL | Status: DC

## 2013-02-20 NOTE — Progress Notes (Signed)
  PROCEDURE RECORD [facility]   Name: SHATIQUA HEROUX DOB:01-17-1971 MRN: [id number]  Date:02/20/2013  Physician: Claudette Laws, MD    Nurse/CMA: Shumaker RN  Allergies:  Allergies  Allergen Reactions  . Betadine [Povidone Iodine]     Thyroid issues  . Bee Venom   . Morphine And Related   . Reglan [Metoclopramide]   . Trazodone And Nefazodone     Consent Signed: yes  Is patient diabetic? no  CBG today?   Pregnant: no LMP: Patient's last menstrual period was 02/19/2013. (age [age])  Anticoagulants: no Anti-inflammatory: no Antibiotics: no  Chlorhexidine used to prep due to betadine sensitivity Procedure: Bilateral medial branch blocks L3-4-5 Position: Prone Start Time: 1:39 End Time: 1:49 Fluoro Time: 35 seconds  RN/CMA Designer, multimedia    Time 1:22p 1:55    BP 125/52 120/48    Pulse 103 83    Respirations 14 14    O2 Sat 100 96    S/S 6 6    Pain Level 7/10 5/10     D/C home with her father, patient A & O X 3, D/C instructions reviewed, and sits independently.

## 2013-02-20 NOTE — Progress Notes (Signed)
Bilateral Lumbar , L4  medial branch blocks and L 5 dorsal ramus injection under fluoroscopic guidance  Indication: Lumbar pain which is not relieved by medication management or other conservative care and interfering with self-care and mobility.Last procedure performed 02/29/2012. She's had several months of relief with this procedure  Informed consent was obtained after describing risks and benefits of the procedure with the patient, this includes bleeding, infection, paralysis and medication side effects.  The patient wishes to proceed and has given written consent.  The patient was placed in prone position.  The lumbar area was marked and prepped with Betadine.  One mL of 1% lidocaine was injected into each of 6 areas into the skin and subcutaneous tissue.  Then a 22-gauge 3.5 spinal needle was inserted targeting the junction of the left S1 superior articular process and sacral ala junction. Needle was advanced under fluoroscopic guidance.  Bone contact was made.  Omnipaque 180 was injected x 0.5 mL demonstrating no intravascular uptake.  Then a solution containing one mL of 4 mg per mL dexamethasone and 3 mL of 2% MPF lidocaine was injected x 0.5 mL.  Then the left L5 superior articular process in transverse process junction was targeted.  Bone contact was made.  Omnipaque 180 was injected x 0.5 mL demonstrating no intravascular uptake. Then a solution containing one mL of 4 mg per mL dexamethasone and 3 mL of 2% MPF lidocaine was injected x 0.5 mL. .  This same procedure was performed on the right side using the same needle, technique and injectate.  Patient tolerated procedure well.  Post procedure instructions were given.

## 2013-02-20 NOTE — Patient Instructions (Addendum)
Lumbar medial branch blocks were performed. This is to help diagnose the cause of the low back pain. It is important that you keep track of your pain for the first day or 2 after injection. This injection can give you temporary relief that lasts for hours or up to several months. There is no way to predict duration of pain relief.  Please try to compare your pain after injection to for the injection.  If this injection gives you  temporary relief there may be another longer-lasting procedure that may be beneficial call radiofrequency ablation  You may be discharged if there is alcohol found on a urine drug screen in the future

## 2013-03-12 ENCOUNTER — Other Ambulatory Visit: Payer: Self-pay

## 2013-03-12 MED ORDER — ZOLPIDEM TARTRATE 10 MG PO TABS: 10.0000 mg | ORAL_TABLET | Freq: Every evening | ORAL | Status: DC | PRN

## 2013-03-12 MED ORDER — IBUPROFEN 600 MG PO TABS: 600.0000 mg | ORAL_TABLET | Freq: Three times a day (TID) | ORAL | Status: DC | PRN

## 2013-03-25 ENCOUNTER — Ambulatory Visit (HOSPITAL_BASED_OUTPATIENT_CLINIC_OR_DEPARTMENT_OTHER): Payer: No Typology Code available for payment source | Admitting: Physical Medicine & Rehabilitation

## 2013-03-25 ENCOUNTER — Encounter: Payer: BC Managed Care – PPO | Attending: Physical Medicine & Rehabilitation

## 2013-03-25 ENCOUNTER — Encounter: Payer: Self-pay | Admitting: Physical Medicine & Rehabilitation

## 2013-03-25 VITALS — BP 112/54 | HR 98 | Resp 14 | Ht 64.0 in | Wt 110.0 lb

## 2013-03-25 DIAGNOSIS — M545 Low back pain, unspecified: Secondary | ICD-10-CM | POA: Insufficient documentation

## 2013-03-25 DIAGNOSIS — M5380 Other specified dorsopathies, site unspecified: Secondary | ICD-10-CM

## 2013-03-25 DIAGNOSIS — M533 Sacrococcygeal disorders, not elsewhere classified: Secondary | ICD-10-CM

## 2013-03-25 DIAGNOSIS — IMO0001 Reserved for inherently not codable concepts without codable children: Secondary | ICD-10-CM

## 2013-03-25 DIAGNOSIS — M5137 Other intervertebral disc degeneration, lumbosacral region: Secondary | ICD-10-CM

## 2013-03-25 DIAGNOSIS — M47816 Spondylosis without myelopathy or radiculopathy, lumbar region: Secondary | ICD-10-CM

## 2013-03-25 DIAGNOSIS — M7918 Myalgia, other site: Secondary | ICD-10-CM

## 2013-03-25 DIAGNOSIS — F17200 Nicotine dependence, unspecified, uncomplicated: Secondary | ICD-10-CM | POA: Insufficient documentation

## 2013-03-25 DIAGNOSIS — K219 Gastro-esophageal reflux disease without esophagitis: Secondary | ICD-10-CM | POA: Diagnosis not present

## 2013-03-25 DIAGNOSIS — M5136 Other intervertebral disc degeneration, lumbar region: Secondary | ICD-10-CM

## 2013-03-25 MED ORDER — HYDROCODONE-ACETAMINOPHEN 10-325 MG PO TABS: ORAL_TABLET | ORAL | Status: DC
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
## 2013-03-25 NOTE — Patient Instructions (Signed)
Do not fill your prescription until 12/ 2  Next appointment will be for a Right sacroiliac injection

## 2013-05-06 ENCOUNTER — Encounter: Payer: No Typology Code available for payment source | Attending: Physical Medicine & Rehabilitation

## 2013-05-06 ENCOUNTER — Ambulatory Visit (HOSPITAL_BASED_OUTPATIENT_CLINIC_OR_DEPARTMENT_OTHER): Payer: No Typology Code available for payment source | Admitting: Physical Medicine & Rehabilitation

## 2013-05-06 ENCOUNTER — Encounter: Payer: Self-pay | Admitting: Physical Medicine & Rehabilitation

## 2013-05-06 VITALS — BP 115/70 | HR 100 | Resp 14 | Ht 65.0 in | Wt 112.0 lb

## 2013-05-06 DIAGNOSIS — F17200 Nicotine dependence, unspecified, uncomplicated: Secondary | ICD-10-CM | POA: Diagnosis not present

## 2013-05-06 DIAGNOSIS — K219 Gastro-esophageal reflux disease without esophagitis: Secondary | ICD-10-CM | POA: Insufficient documentation

## 2013-05-06 DIAGNOSIS — M5380 Other specified dorsopathies, site unspecified: Secondary | ICD-10-CM | POA: Diagnosis present

## 2013-05-06 DIAGNOSIS — M47817 Spondylosis without myelopathy or radiculopathy, lumbosacral region: Secondary | ICD-10-CM

## 2013-05-06 DIAGNOSIS — M545 Low back pain, unspecified: Secondary | ICD-10-CM | POA: Insufficient documentation

## 2013-05-06 DIAGNOSIS — M47816 Spondylosis without myelopathy or radiculopathy, lumbar region: Secondary | ICD-10-CM

## 2013-05-06 MED ORDER — HYDROCODONE-ACETAMINOPHEN 10-325 MG PO TABS: ORAL_TABLET | ORAL | Status: DC

## 2013-05-06 MED ORDER — DIAZEPAM 10 MG PO TABS: 10.0000 mg | ORAL_TABLET | Freq: Three times a day (TID) | ORAL | Status: DC | PRN

## 2013-05-06 NOTE — Progress Notes (Signed)
Bilateral Lumbar L2, L3, L4  medial branch blocks , injection under fluoroscopic guidance  Indication: Lumbar pain which is not relieved by medication management or other conservative care and interfering with self-care and mobility.  Informed consent was obtained after describing risks and benefits of the procedure with the patient, this includes bleeding, infection, paralysis and medication side effects.  The patient wishes to proceed and has given written consent.  The patient was placed in prone position.  The lumbar area was marked and prepped with Betadine.  One mL of 1% lidocaine was injected into each of 6 areas into the skin and subcutaneous tissue.  Then a 22-gauge 3.5 inch spinal needle was inserted targeting the junction of the left L3 superior articular process and transverse junction. Needle was advanced under fluoroscopic guidance.  Bone contact was made.  Omnipaque 180 was injected x 0.5 mL demonstrating no intravascular uptake.  Then a solution containing one mL of 4 mg per mL dexamethasone and 3 mL of 2% MPF lidocaine was injected x 0.5 mL.  Then the left L5 superior articular process in transverse process junction was targeted.  Bone contact was made.  Omnipaque 180 was injected x 0.5 mL demonstrating no intravascular uptake. Then a solution containing one mL of 4 mg per mL dexamethasone and 3 mL of 2% MPF lidocaine was injected x 0.5 mL.  Then the left L4 superior articular process in transverse process junction was targeted.  Bone contact was made.  Omnipaque 180 was injected x 0.5 mL demonstrating no intravascular uptake.  Then a solution containing one mL of 4 mg per mL dexamethasone and 3 mL if 2% MPF lidocaine was injected x 0.5 mL.  This same procedure was performed on the right side using the same needle, technique and injectate.  Patient tolerated procedure well.  Post procedure instructions were given.

## 2013-05-06 NOTE — Patient Instructions (Addendum)
L2,3,4 Lumbar medial branch blocks were performed. This is to help diagnose the cause of the low back pain. It is important that you keep track of your pain for the first day or 2 after injection. This injection can give you temporary relief that lasts for hours or up to several months. There is no way to predict duration of pain relief.  Please try to compare your pain after injection to for the injection.  If this injection gives you  temporary relief there may be another longer-lasting procedure that may be beneficial called radiofrequency ablation  Referral to Dr Alvester Morin [facility] at [facility] orthopedics Please get your THoracic injection and RF records from [facility] for Dr Alvester Morin to review

## 2013-06-05 ENCOUNTER — Encounter (INDEPENDENT_AMBULATORY_CARE_PROVIDER_SITE_OTHER): Payer: Self-pay

## 2013-06-05 ENCOUNTER — Ambulatory Visit (HOSPITAL_BASED_OUTPATIENT_CLINIC_OR_DEPARTMENT_OTHER): Payer: BC Managed Care – PPO | Admitting: Physical Medicine & Rehabilitation

## 2013-06-05 ENCOUNTER — Encounter: Payer: Self-pay | Admitting: Physical Medicine & Rehabilitation

## 2013-06-05 ENCOUNTER — Encounter: Payer: BC Managed Care – PPO | Attending: Physical Medicine & Rehabilitation

## 2013-06-05 VITALS — BP 117/67 | HR 93 | Resp 14 | Ht 65.0 in | Wt 118.0 lb

## 2013-06-05 DIAGNOSIS — M47894 Other spondylosis, thoracic region: Secondary | ICD-10-CM

## 2013-06-05 DIAGNOSIS — M5380 Other specified dorsopathies, site unspecified: Secondary | ICD-10-CM

## 2013-06-05 DIAGNOSIS — M545 Low back pain, unspecified: Secondary | ICD-10-CM | POA: Insufficient documentation

## 2013-06-05 DIAGNOSIS — F17200 Nicotine dependence, unspecified, uncomplicated: Secondary | ICD-10-CM | POA: Insufficient documentation

## 2013-06-05 DIAGNOSIS — M5137 Other intervertebral disc degeneration, lumbosacral region: Secondary | ICD-10-CM

## 2013-06-05 DIAGNOSIS — K219 Gastro-esophageal reflux disease without esophagitis: Secondary | ICD-10-CM | POA: Insufficient documentation

## 2013-06-05 DIAGNOSIS — M47816 Spondylosis without myelopathy or radiculopathy, lumbar region: Secondary | ICD-10-CM

## 2013-06-05 DIAGNOSIS — IMO0001 Reserved for inherently not codable concepts without codable children: Secondary | ICD-10-CM

## 2013-06-05 DIAGNOSIS — M5136 Other intervertebral disc degeneration, lumbar region: Secondary | ICD-10-CM

## 2013-06-05 DIAGNOSIS — M47814 Spondylosis without myelopathy or radiculopathy, thoracic region: Principal | ICD-10-CM

## 2013-06-05 DIAGNOSIS — M7918 Myalgia, other site: Secondary | ICD-10-CM

## 2013-06-05 MED ORDER — HYDROCODONE-ACETAMINOPHEN 10-325 MG PO TABS: ORAL_TABLET | ORAL | Status: DC
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
## 2013-06-27 ENCOUNTER — Other Ambulatory Visit: Payer: Self-pay | Admitting: *Deleted

## 2013-06-27 MED ORDER — HYDROCODONE-ACETAMINOPHEN 10-325 MG PO TABS: 1.0000 | ORAL_TABLET | Freq: Four times a day (QID) | ORAL | Status: DC | PRN

## 2013-06-27 NOTE — Telephone Encounter (Signed)
RX printed early for controlled medication for the visit with RN on 06/30/13 (to be signed by MD) 

## 2013-06-30 ENCOUNTER — Encounter: Payer: BC Managed Care – PPO | Attending: Physical Medicine & Rehabilitation | Admitting: *Deleted

## 2013-06-30 VITALS — BP 150/74 | HR 104 | Resp 14 | Wt 111.5 lb

## 2013-06-30 DIAGNOSIS — F3289 Other specified depressive episodes: Secondary | ICD-10-CM | POA: Insufficient documentation

## 2013-06-30 DIAGNOSIS — K219 Gastro-esophageal reflux disease without esophagitis: Secondary | ICD-10-CM | POA: Insufficient documentation

## 2013-06-30 DIAGNOSIS — M47817 Spondylosis without myelopathy or radiculopathy, lumbosacral region: Secondary | ICD-10-CM | POA: Insufficient documentation

## 2013-06-30 DIAGNOSIS — R079 Chest pain, unspecified: Secondary | ICD-10-CM | POA: Insufficient documentation

## 2013-06-30 DIAGNOSIS — M5136 Other intervertebral disc degeneration, lumbar region: Secondary | ICD-10-CM

## 2013-06-30 DIAGNOSIS — G8929 Other chronic pain: Secondary | ICD-10-CM | POA: Insufficient documentation

## 2013-06-30 DIAGNOSIS — I7300 Raynaud's syndrome without gangrene: Secondary | ICD-10-CM | POA: Insufficient documentation

## 2013-06-30 DIAGNOSIS — M47814 Spondylosis without myelopathy or radiculopathy, thoracic region: Secondary | ICD-10-CM | POA: Insufficient documentation

## 2013-06-30 DIAGNOSIS — E079 Disorder of thyroid, unspecified: Secondary | ICD-10-CM | POA: Insufficient documentation

## 2013-06-30 DIAGNOSIS — F329 Major depressive disorder, single episode, unspecified: Secondary | ICD-10-CM | POA: Insufficient documentation

## 2013-06-30 DIAGNOSIS — M51379 Other intervertebral disc degeneration, lumbosacral region without mention of lumbar back pain or lower extremity pain: Secondary | ICD-10-CM | POA: Insufficient documentation

## 2013-06-30 DIAGNOSIS — M47894 Other spondylosis, thoracic region: Secondary | ICD-10-CM

## 2013-06-30 DIAGNOSIS — M5137 Other intervertebral disc degeneration, lumbosacral region: Secondary | ICD-10-CM | POA: Insufficient documentation

## 2013-06-30 DIAGNOSIS — M549 Dorsalgia, unspecified: Secondary | ICD-10-CM

## 2013-06-30 MED ORDER — ZOLPIDEM TARTRATE 10 MG PO TABS: 10.0000 mg | ORAL_TABLET | Freq: Every evening | ORAL | Status: DC | PRN

## 2013-06-30 MED ORDER — DIAZEPAM 10 MG PO TABS: 10.0000 mg | ORAL_TABLET | Freq: Three times a day (TID) | ORAL | Status: DC | PRN
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
## 2013-06-30 NOTE — Patient Instructions (Signed)
Follow up one month with RN for med refill and pill count 

## 2013-07-21 ENCOUNTER — Other Ambulatory Visit: Payer: Self-pay | Admitting: *Deleted

## 2013-07-21 MED ORDER — HYDROCODONE-ACETAMINOPHEN 10-325 MG PO TABS: 1.0000 | ORAL_TABLET | Freq: Four times a day (QID) | ORAL | Status: DC | PRN

## 2013-07-21 NOTE — Telephone Encounter (Addendum)
RX printed for MD to sign. Letter written for discharge from clinic due to letter we received [place]ating [place] Beverely LowJudith Rowland.  RX to be given for 30 days of hydrocodone and a list of surrounding pain clinics

## 2017-04-02 ENCOUNTER — Encounter (INDEPENDENT_AMBULATORY_CARE_PROVIDER_SITE_OTHER): Payer: Self-pay | Admitting: Orthopaedic Surgery

## 2017-04-02 ENCOUNTER — Ambulatory Visit (INDEPENDENT_AMBULATORY_CARE_PROVIDER_SITE_OTHER): Payer: BLUE CROSS/BLUE SHIELD | Admitting: Orthopaedic Surgery

## 2017-04-02 ENCOUNTER — Ambulatory Visit (INDEPENDENT_AMBULATORY_CARE_PROVIDER_SITE_OTHER): Payer: Self-pay

## 2017-04-02 DIAGNOSIS — M545 Low back pain, unspecified: Secondary | ICD-10-CM

## 2017-04-02 MED ORDER — PREDNISONE 10 MG (21) PO TBPK: ORAL_TABLET | ORAL | 0 refills | Status: DC

## 2017-04-02 MED ORDER — TIZANIDINE HCL 4 MG PO TABS: 4.0000 mg | ORAL_TABLET | Freq: Four times a day (QID) | ORAL | 2 refills | Status: DC | PRN

## 2017-04-02 MED ORDER — NAPROXEN 500 MG PO TABS
500.0000 mg | ORAL_TABLET | Freq: Two times a day (BID) | ORAL | 3 refills | Status: DC
Start: 2017-04-02 — End: 2017-04-24
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
## 2017-04-06 ENCOUNTER — Ambulatory Visit (INDEPENDENT_AMBULATORY_CARE_PROVIDER_SITE_OTHER): Payer: BLUE CROSS/BLUE SHIELD | Admitting: Orthopaedic Surgery

## 2017-04-06 ENCOUNTER — Encounter (INDEPENDENT_AMBULATORY_CARE_PROVIDER_SITE_OTHER): Payer: Self-pay | Admitting: Orthopaedic Surgery

## 2017-04-06 DIAGNOSIS — M545 Low back pain, unspecified: Secondary | ICD-10-CM
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
## 2017-04-18 ENCOUNTER — Ambulatory Visit (INDEPENDENT_AMBULATORY_CARE_PROVIDER_SITE_OTHER): Payer: BLUE CROSS/BLUE SHIELD | Admitting: Neurology

## 2017-04-18 DIAGNOSIS — R410 Disorientation, unspecified: Secondary | ICD-10-CM

## 2017-04-19 DIAGNOSIS — M1711 Unilateral primary osteoarthritis, right knee: Secondary | ICD-10-CM | POA: Diagnosis present
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
## 2017-04-19 NOTE — Procedures (Signed)
ELECTROENCEPHALOGRAM REPORT  Date of Study: 04/18/2017  Patient's Name: [name] MRN: [id number] Date of Birth: 1970/12/09  Referring Provider: Dr. Renaye Rakersim Murphy  Clinical History: This is a 46 year old woman with a episodes of confusion. EEG requested by Ortho for surgical clearance.  Medications: Elavil, diazepam, tizanidine  Technical Summary: A multichannel digital EEG recording measured by the international 10-20 system with electrodes applied with paste and impedances below 5000 ohms performed in our laboratory with EKG monitoring in an awake and drowsy patient.  Hyperventilation and photic stimulation were performed.  The digital EEG was referentially recorded, reformatted, and digitally filtered in a variety of bipolar and referential montages for optimal display.    Description: The patient is awake and drowsy during the recording.  During maximal wakefulness, there is a symmetric, medium voltage 10 Hz posterior dominant rhythm that attenuates with eye opening.  The record is symmetric.  During drowsiness, there is an increase in theta slowing of the background. Deeper stages of sleep were not captured. Hyperventilation and photic stimulation did not elicit any abnormalities.  There were no epileptiform discharges or electrographic seizures seen.    EKG lead was unremarkable.  Impression: This awake and drowsy EEG is normal.    Clinical Correlation: A normal EEG does not exclude a clinical diagnosis of epilepsy.  Clinical correlation is advised.   Patrcia DollyKaren Aquino, M.D.

## 2017-04-24 ENCOUNTER — Other Ambulatory Visit: Payer: Self-pay

## 2017-04-24 ENCOUNTER — Encounter (HOSPITAL_BASED_OUTPATIENT_CLINIC_OR_DEPARTMENT_OTHER)
Admission: RE | Admit: 2017-04-24 | Discharge: 2017-04-24 | Disposition: A | Discharge status: Home / Self Care | Payer: BLUE CROSS/BLUE SHIELD | Source: Ambulatory Visit | Attending: Orthopedic Surgery | Admitting: Orthopedic Surgery

## 2017-04-24 ENCOUNTER — Encounter (HOSPITAL_BASED_OUTPATIENT_CLINIC_OR_DEPARTMENT_OTHER): Payer: Self-pay | Admitting: *Deleted

## 2017-04-24 DIAGNOSIS — K219 Gastro-esophageal reflux disease without esophagitis: Secondary | ICD-10-CM

## 2017-04-24 DIAGNOSIS — F329 Major depressive disorder, single episode, unspecified: Secondary | ICD-10-CM

## 2017-04-24 DIAGNOSIS — F419 Anxiety disorder, unspecified: Secondary | ICD-10-CM | POA: Insufficient documentation

## 2017-04-24 DIAGNOSIS — M5136 Other intervertebral disc degeneration, lumbar region: Secondary | ICD-10-CM | POA: Diagnosis not present

## 2017-04-24 DIAGNOSIS — R569 Unspecified convulsions: Secondary | ICD-10-CM

## 2017-04-24 DIAGNOSIS — Z79899 Other long term (current) drug therapy: Secondary | ICD-10-CM | POA: Diagnosis not present

## 2017-04-24 DIAGNOSIS — M549 Dorsalgia, unspecified: Secondary | ICD-10-CM | POA: Diagnosis not present

## 2017-04-24 DIAGNOSIS — M1731 Unilateral post-traumatic osteoarthritis, right knee: Secondary | ICD-10-CM | POA: Diagnosis not present

## 2017-04-24 DIAGNOSIS — F17200 Nicotine dependence, unspecified, uncomplicated: Secondary | ICD-10-CM

## 2017-04-24 DIAGNOSIS — Z791 Long term (current) use of non-steroidal anti-inflammatories (NSAID): Secondary | ICD-10-CM | POA: Diagnosis not present

## 2017-04-24 DIAGNOSIS — M1711 Unilateral primary osteoarthritis, right knee: Secondary | ICD-10-CM | POA: Diagnosis present

## 2017-04-24 DIAGNOSIS — G8929 Other chronic pain: Secondary | ICD-10-CM | POA: Diagnosis not present

## 2017-04-24 DIAGNOSIS — I7300 Raynaud's syndrome without gangrene: Secondary | ICD-10-CM | POA: Diagnosis not present

## 2017-04-24 DIAGNOSIS — M1990 Unspecified osteoarthritis, unspecified site: Secondary | ICD-10-CM | POA: Insufficient documentation

## 2017-04-24 DIAGNOSIS — Z01812 Encounter for preprocedural laboratory examination: Secondary | ICD-10-CM

## 2017-04-24 DIAGNOSIS — I739 Peripheral vascular disease, unspecified: Secondary | ICD-10-CM | POA: Insufficient documentation

## 2017-04-24 DIAGNOSIS — F418 Other specified anxiety disorders: Secondary | ICD-10-CM | POA: Diagnosis not present

## 2017-04-24 DIAGNOSIS — F17210 Nicotine dependence, cigarettes, uncomplicated: Secondary | ICD-10-CM | POA: Diagnosis not present

## 2017-04-24 LAB — BASIC METABOLIC PANEL
Anion gap: 8 (ref 5–15)
BUN: 7 mg/dL (ref 6–20)
CALCIUM: 9.1 mg/dL (ref 8.9–10.3)
CO2: 25 mmol/L (ref 22–32)
CREATININE: 0.75 mg/dL (ref 0.44–1.00)
Chloride: 106 mmol/L (ref 101–111)
GFR calc non Af Amer: >60 mL/min (ref >60)
Glucose, Bld: 113 mg/dL — ABNORMAL HIGH (ref 65–99)
Potassium: 4.2 mmol/L (ref 3.5–5.1)
SODIUM: 139 mmol/L (ref 135–145)

## 2017-04-24 LAB — CBC
HCT: 38.6 % (ref 36.0–46.0)
HEMOGLOBIN: 12.8 g/dL (ref 12.0–15.0)
MCH: 28.4 pg (ref 26.0–34.0)
MCHC: 33.2 g/dL (ref 30.0–36.0)
MCV: 85.8 fL (ref 78.0–100.0)
Platelets: 355 10*3/uL (ref 150–400)
RBC: 4.5 MIL/uL (ref 3.87–5.11)
RDW: 14 % (ref 11.5–15.5)
WBC: 7.1 10*3/uL (ref 4.0–10.5)

## 2017-04-25 LAB — SURGICAL PCR SCREEN
MRSA, PCR: POSITIVE — AB
STAPHYLOCOCCUS AUREUS: POSITIVE — AB

## 2017-04-25 NOTE — Progress Notes (Signed)
Dr. Wandra Feinstein Murphy aware lab results from 12/18 positive MRSA and staph. Rx for mupirocin ointment already awaiting pt. pickup at [facility] [place]. Pt received instructions during lab visit 12/18 on how to use med. Left message for pt to call [facility] asap for lab results. Talked with Tresa EndoKelly (OR scheduler) with Dr. Eulah PontMurphy made aware of above and I have been unable to reach pt. States she will contact pt and make sure she picks up med and begins today . Also will have pt come in day of surgery 2 1/2 hours early to get antibiotic and betadine nasal swab prior to procedure. Dr Eulah PontMurphy will add new orders to pre op.

## 2017-04-27 ENCOUNTER — Ambulatory Visit (HOSPITAL_BASED_OUTPATIENT_CLINIC_OR_DEPARTMENT_OTHER): Payer: BLUE CROSS/BLUE SHIELD | Admitting: Anesthesiology

## 2017-04-27 ENCOUNTER — Ambulatory Visit (HOSPITAL_BASED_OUTPATIENT_CLINIC_OR_DEPARTMENT_OTHER)
Admission: RE | Admit: 2017-04-27 | Discharge: 2017-04-28 | Disposition: A | Discharge status: Home / Self Care | Payer: BLUE CROSS/BLUE SHIELD | Source: Ambulatory Visit | Attending: Orthopedic Surgery | Admitting: Orthopedic Surgery

## 2017-04-27 ENCOUNTER — Ambulatory Visit (HOSPITAL_COMMUNITY): Payer: BLUE CROSS/BLUE SHIELD

## 2017-04-27 ENCOUNTER — Encounter (HOSPITAL_BASED_OUTPATIENT_CLINIC_OR_DEPARTMENT_OTHER)
Admission: RE | Disposition: A | Discharge status: Home / Self Care | Payer: Self-pay | Source: Ambulatory Visit | Attending: Orthopedic Surgery

## 2017-04-27 ENCOUNTER — Encounter (HOSPITAL_BASED_OUTPATIENT_CLINIC_OR_DEPARTMENT_OTHER): Payer: Self-pay | Admitting: *Deleted

## 2017-04-27 ENCOUNTER — Other Ambulatory Visit: Payer: Self-pay

## 2017-04-27 DIAGNOSIS — M47814 Spondylosis without myelopathy or radiculopathy, thoracic region: Secondary | ICD-10-CM

## 2017-04-27 DIAGNOSIS — R569 Unspecified convulsions: Secondary | ICD-10-CM | POA: Insufficient documentation

## 2017-04-27 DIAGNOSIS — F17210 Nicotine dependence, cigarettes, uncomplicated: Secondary | ICD-10-CM | POA: Insufficient documentation

## 2017-04-27 DIAGNOSIS — M1711 Unilateral primary osteoarthritis, right knee: Secondary | ICD-10-CM

## 2017-04-27 DIAGNOSIS — M1991 Primary osteoarthritis, unspecified site: Secondary | ICD-10-CM | POA: Diagnosis present

## 2017-04-27 DIAGNOSIS — M549 Dorsalgia, unspecified: Secondary | ICD-10-CM | POA: Diagnosis present

## 2017-04-27 DIAGNOSIS — F329 Major depressive disorder, single episode, unspecified: Secondary | ICD-10-CM | POA: Insufficient documentation

## 2017-04-27 DIAGNOSIS — K219 Gastro-esophageal reflux disease without esophagitis: Secondary | ICD-10-CM | POA: Insufficient documentation

## 2017-04-27 DIAGNOSIS — Z9889 Other specified postprocedural states: Secondary | ICD-10-CM

## 2017-04-27 DIAGNOSIS — M5136 Other intervertebral disc degeneration, lumbar region: Secondary | ICD-10-CM | POA: Diagnosis present

## 2017-04-27 DIAGNOSIS — I739 Peripheral vascular disease, unspecified: Secondary | ICD-10-CM | POA: Insufficient documentation

## 2017-04-27 DIAGNOSIS — M1731 Unilateral post-traumatic osteoarthritis, right knee: Secondary | ICD-10-CM | POA: Diagnosis not present

## 2017-04-27 DIAGNOSIS — M47894 Other spondylosis, thoracic region: Secondary | ICD-10-CM | POA: Diagnosis present

## 2017-04-27 DIAGNOSIS — Z791 Long term (current) use of non-steroidal anti-inflammatories (NSAID): Secondary | ICD-10-CM | POA: Insufficient documentation

## 2017-04-27 DIAGNOSIS — I7300 Raynaud's syndrome without gangrene: Secondary | ICD-10-CM | POA: Insufficient documentation

## 2017-04-27 DIAGNOSIS — Z79899 Other long term (current) drug therapy: Secondary | ICD-10-CM | POA: Insufficient documentation

## 2017-04-27 DIAGNOSIS — G8929 Other chronic pain: Secondary | ICD-10-CM | POA: Insufficient documentation

## 2017-04-27 DIAGNOSIS — M51369 Other intervertebral disc degeneration, lumbar region without mention of lumbar back pain or lower extremity pain: Secondary | ICD-10-CM | POA: Diagnosis present

## 2017-04-27 DIAGNOSIS — F418 Other specified anxiety disorders: Secondary | ICD-10-CM | POA: Insufficient documentation

## 2017-04-27 HISTORY — PX: PATELLA-FEMORAL ARTHROPLASTY: SHX5037

## 2017-04-27 HISTORY — DX: Anxiety disorder, unspecified: F41.9

## 2017-04-27 SURGERY — ARTHROPLASTY, PATELLOFEMORAL
Anesthesia: General | Site: Knee | Laterality: Right

## 2017-04-27 MED ORDER — GABAPENTIN 300 MG PO CAPS
300.0000 mg | ORAL_CAPSULE | Freq: Once | ORAL | Status: AC
  Administered 2017-04-27: 300 mg via ORAL

## 2017-04-27 MED ORDER — PROPOFOL 500 MG/50ML IV EMUL
INTRAVENOUS | Status: AC
  Filled 2017-04-27: qty 50

## 2017-04-27 MED ORDER — OXYCODONE HCL 5 MG PO TABS
5.0000 mg | ORAL_TABLET | ORAL | Status: DC | PRN
  Administered 2017-04-27 – 2017-04-28 (×3): 5 mg via ORAL
  Filled 2017-04-27 (×3): qty 1

## 2017-04-27 MED ORDER — SCOPOLAMINE 1 MG/3DAYS TD PT72
MEDICATED_PATCH | TRANSDERMAL | Status: AC
  Filled 2017-04-27: qty 1

## 2017-04-27 MED ORDER — AMITRIPTYLINE HCL 25 MG PO TABS
25.0000 mg | ORAL_TABLET | Freq: Every day | ORAL | Status: DC
  Administered 2017-04-27: 25 mg via ORAL

## 2017-04-27 MED ORDER — ONDANSETRON HCL 4 MG/2ML IJ SOLN
INTRAMUSCULAR | Status: DC | PRN
  Administered 2017-04-27: 4 mg via INTRAVENOUS

## 2017-04-27 MED ORDER — ASPIRIN 325 MG PO TABS: 325.0000 mg | ORAL_TABLET | Freq: Every day | ORAL | Status: DC

## 2017-04-27 MED ORDER — PANTOPRAZOLE SODIUM 40 MG PO TBEC: 40.0000 mg | DELAYED_RELEASE_TABLET | Freq: Every day | ORAL | Status: DC

## 2017-04-27 MED ORDER — CEFAZOLIN SODIUM-DEXTROSE 2-4 GM/100ML-% IV SOLN
2.0000 g | INTRAVENOUS | Status: AC
  Administered 2017-04-27: 2 g via INTRAVENOUS

## 2017-04-27 MED ORDER — OXYCODONE HCL 5 MG/5ML PO SOLN: 5.0000 mg | Freq: Once | ORAL | Status: DC | PRN

## 2017-04-27 MED ORDER — ONDANSETRON HCL 4 MG/2ML IJ SOLN: 4.0000 mg | Freq: Four times a day (QID) | INTRAMUSCULAR | Status: DC | PRN

## 2017-04-27 MED ORDER — DEXAMETHASONE SODIUM PHOSPHATE 10 MG/ML IJ SOLN
INTRAMUSCULAR | Status: AC
  Filled 2017-04-27: qty 1

## 2017-04-27 MED ORDER — MIDAZOLAM HCL 2 MG/2ML IJ SOLN
1.0000 mg | INTRAMUSCULAR | Status: DC | PRN
  Administered 2017-04-27 (×2): 2 mg via INTRAVENOUS

## 2017-04-27 MED ORDER — ZOLPIDEM TARTRATE 5 MG PO TABS: 5.0000 mg | ORAL_TABLET | Freq: Every evening | ORAL | Status: DC | PRN

## 2017-04-27 MED ORDER — VANCOMYCIN HCL IN DEXTROSE 1-5 GM/200ML-% IV SOLN
INTRAVENOUS | Status: AC
  Filled 2017-04-27: qty 200

## 2017-04-27 MED ORDER — HYDROMORPHONE HCL 1 MG/ML IJ SOLN: 0.2500 mg | INTRAMUSCULAR | Status: DC | PRN

## 2017-04-27 MED ORDER — LACTATED RINGERS IV SOLN
INTRAVENOUS | Status: DC
  Administered 2017-04-27: 11:00:00 via INTRAVENOUS

## 2017-04-27 MED ORDER — METHOCARBAMOL 500 MG PO TABS: 500.0000 mg | ORAL_TABLET | Freq: Four times a day (QID) | ORAL | 1 refills | Status: AC | PRN

## 2017-04-27 MED ORDER — MIDAZOLAM HCL 2 MG/2ML IJ SOLN
INTRAMUSCULAR | Status: AC
  Filled 2017-04-27: qty 2

## 2017-04-27 MED ORDER — CALCIUM CARBONATE 600 MG PO TABS: 600.0000 mg | ORAL_TABLET | Freq: Two times a day (BID) | ORAL | Status: DC

## 2017-04-27 MED ORDER — DEXMEDETOMIDINE HCL 200 MCG/2ML IV SOLN
INTRAVENOUS | Status: DC | PRN
  Administered 2017-04-27: 20 ug via INTRAVENOUS

## 2017-04-27 MED ORDER — BUPIVACAINE HCL (PF) 0.25 % IJ SOLN
INTRAMUSCULAR | Status: AC
  Filled 2017-04-27: qty 30

## 2017-04-27 MED ORDER — PROPOFOL 10 MG/ML IV BOLUS
INTRAVENOUS | Status: DC | PRN
  Administered 2017-04-27: 20 mg via INTRAVENOUS
  Administered 2017-04-27: 150 mg via INTRAVENOUS

## 2017-04-27 MED ORDER — METOCLOPRAMIDE HCL 5 MG/ML IJ SOLN: 5.0000 mg | Freq: Three times a day (TID) | INTRAMUSCULAR | Status: DC | PRN

## 2017-04-27 MED ORDER — ONDANSETRON HCL 4 MG PO TABS: 4.0000 mg | ORAL_TABLET | Freq: Four times a day (QID) | ORAL | Status: DC | PRN

## 2017-04-27 MED ORDER — VANCOMYCIN HCL IN DEXTROSE 1-5 GM/200ML-% IV SOLN
1000.0000 mg | INTRAVENOUS | Status: AC
  Administered 2017-04-27: 1000 mg via INTRAVENOUS

## 2017-04-27 MED ORDER — DEXAMETHASONE SODIUM PHOSPHATE 10 MG/ML IJ SOLN
INTRAMUSCULAR | Status: DC | PRN
  Administered 2017-04-27: 10 mg via INTRAVENOUS

## 2017-04-27 MED ORDER — FENTANYL CITRATE (PF) 100 MCG/2ML IJ SOLN
INTRAMUSCULAR | Status: AC
  Filled 2017-04-27: qty 2

## 2017-04-27 MED ORDER — BUPIVACAINE LIPOSOME 1.3 % IJ SUSP
INTRAMUSCULAR | Status: AC
  Filled 2017-04-27: qty 20

## 2017-04-27 MED ORDER — LACTATED RINGERS IV SOLN
INTRAVENOUS | Status: DC
  Administered 2017-04-27: 13:00:00 via INTRAVENOUS

## 2017-04-27 MED ORDER — ACETAMINOPHEN 500 MG PO TABS
ORAL_TABLET | ORAL | Status: AC
  Filled 2017-04-27: qty 2

## 2017-04-27 MED ORDER — BUPIVACAINE LIPOSOME 1.3 % IJ SUSP
INTRAMUSCULAR | Status: DC | PRN
  Administered 2017-04-27: 20 mL

## 2017-04-27 MED ORDER — METHOCARBAMOL 500 MG PO TABS
500.0000 mg | ORAL_TABLET | Freq: Four times a day (QID) | ORAL | Status: DC | PRN
  Administered 2017-04-27 – 2017-04-28 (×3): 500 mg via ORAL
  Filled 2017-04-27 (×3): qty 1

## 2017-04-27 MED ORDER — POVIDONE-IODINE 10 % EX SWAB: 2.0000 "application " | Freq: Once | CUTANEOUS | Status: DC

## 2017-04-27 MED ORDER — METOCLOPRAMIDE HCL 5 MG PO TABS: 5.0000 mg | ORAL_TABLET | Freq: Three times a day (TID) | ORAL | Status: DC | PRN

## 2017-04-27 MED ORDER — OXYCODONE HCL 5 MG PO TABS: 5.0000 mg | ORAL_TABLET | Freq: Once | ORAL | Status: DC | PRN

## 2017-04-27 MED ORDER — ONDANSETRON HCL 4 MG/2ML IJ SOLN
INTRAMUSCULAR | Status: AC
  Filled 2017-04-27: qty 2

## 2017-04-27 MED ORDER — VANCOMYCIN HCL IN DEXTROSE 1-5 GM/200ML-% IV SOLN: 1000.0000 mg | INTRAVENOUS | Status: DC

## 2017-04-27 MED ORDER — SODIUM CHLORIDE 0.9 % IJ SOLN
INTRAMUSCULAR | Status: DC | PRN
  Administered 2017-04-27: 20 mL via INTRAVENOUS

## 2017-04-27 MED ORDER — TRANEXAMIC ACID 1000 MG/10ML IV SOLN
INTRAVENOUS | Status: DC | PRN
  Administered 2017-04-27: 1000 mg via INTRAVENOUS

## 2017-04-27 MED ORDER — FENTANYL CITRATE (PF) 100 MCG/2ML IJ SOLN
50.0000 ug | INTRAMUSCULAR | Status: DC | PRN
  Administered 2017-04-27: 100 ug via INTRAVENOUS

## 2017-04-27 MED ORDER — MORPHINE SULFATE (PF) 2 MG/ML IV SOLN: 2.0000 mg | INTRAVENOUS | Status: DC | PRN

## 2017-04-27 MED ORDER — KETOROLAC TROMETHAMINE 30 MG/ML IJ SOLN
INTRAMUSCULAR | Status: AC
  Filled 2017-04-27: qty 1

## 2017-04-27 MED ORDER — CEFAZOLIN SODIUM-DEXTROSE 2-4 GM/100ML-% IV SOLN
2.0000 g | Freq: Four times a day (QID) | INTRAVENOUS | Status: AC
  Administered 2017-04-27 – 2017-04-28 (×3): 2 g via INTRAVENOUS
  Filled 2017-04-27 (×3): qty 100

## 2017-04-27 MED ORDER — OXYCODONE-ACETAMINOPHEN 5-325 MG PO TABS: 1.0000 | ORAL_TABLET | ORAL | 0 refills | Status: AC | PRN

## 2017-04-27 MED ORDER — ONDANSETRON HCL 4 MG PO TABS: 4.0000 mg | ORAL_TABLET | Freq: Three times a day (TID) | ORAL | 0 refills | Status: AC | PRN

## 2017-04-27 MED ORDER — GABAPENTIN 300 MG PO CAPS
ORAL_CAPSULE | ORAL | Status: AC
  Filled 2017-04-27: qty 1

## 2017-04-27 MED ORDER — TRANEXAMIC ACID 1000 MG/10ML IV SOLN
1000.0000 mg | INTRAVENOUS | Status: AC
  Filled 2017-04-27: qty 10

## 2017-04-27 MED ORDER — LIDOCAINE 2% (20 MG/ML) 5 ML SYRINGE
INTRAMUSCULAR | Status: AC
  Filled 2017-04-27: qty 5

## 2017-04-27 MED ORDER — METHOCARBAMOL 1000 MG/10ML IJ SOLN: 500.0000 mg | Freq: Four times a day (QID) | INTRAVENOUS | Status: DC | PRN

## 2017-04-27 MED ORDER — ACETAMINOPHEN 650 MG RE SUPP: 650.0000 mg | RECTAL | Status: DC | PRN

## 2017-04-27 MED ORDER — PROPOFOL 500 MG/50ML IV EMUL
INTRAVENOUS | Status: DC | PRN
  Administered 2017-04-27 (×2): via INTRAVENOUS
  Administered 2017-04-27: 200 ug/kg/min via INTRAVENOUS

## 2017-04-27 MED ORDER — CEFAZOLIN SODIUM-DEXTROSE 2-4 GM/100ML-% IV SOLN
INTRAVENOUS | Status: AC
  Filled 2017-04-27: qty 100

## 2017-04-27 MED ORDER — CHLORHEXIDINE GLUCONATE 4 % EX LIQD: 60.0000 mL | Freq: Once | CUTANEOUS | Status: DC

## 2017-04-27 MED ORDER — SODIUM CHLORIDE 0.9 % IJ SOLN
INTRAMUSCULAR | Status: AC
  Filled 2017-04-27: qty 20

## 2017-04-27 MED ORDER — ACETAMINOPHEN 500 MG PO TABS
1000.0000 mg | ORAL_TABLET | Freq: Once | ORAL | Status: AC
  Administered 2017-04-27: 1000 mg via ORAL

## 2017-04-27 MED ORDER — SCOPOLAMINE 1 MG/3DAYS TD PT72
1.0000 | MEDICATED_PATCH | Freq: Once | TRANSDERMAL | Status: DC | PRN
  Administered 2017-04-27: 1.5 mg via TRANSDERMAL

## 2017-04-27 MED ORDER — LIDOCAINE HCL (CARDIAC) 20 MG/ML IV SOLN
INTRAVENOUS | Status: DC | PRN
  Administered 2017-04-27: 50 mg via INTRAVENOUS

## 2017-04-27 MED ORDER — ACETAMINOPHEN 325 MG PO TABS
650.0000 mg | ORAL_TABLET | ORAL | Status: DC | PRN
  Administered 2017-04-27 – 2017-04-28 (×3): 650 mg via ORAL
  Filled 2017-04-27 (×3): qty 2

## 2017-04-27 MED ORDER — ASPIRIN EC 325 MG PO TBEC: 325.0000 mg | DELAYED_RELEASE_TABLET | Freq: Every day | ORAL | 0 refills | Status: AC

## 2017-04-27 SURGICAL SUPPLY — 86 items
APL SKNCLS STERI-STRIP NONHPOA (GAUZE/BANDAGES/DRESSINGS) ×1
BAG DECANTER FOR FLEXI CONT (MISCELLANEOUS) ×3 IMPLANT
BANDAGE ACE 4X5 VEL STRL LF (GAUZE/BANDAGES/DRESSINGS) ×3 IMPLANT
BANDAGE ACE 6X5 VEL STRL LF (GAUZE/BANDAGES/DRESSINGS) ×3 IMPLANT
BANDAGE ESMARK 6X9 LF (GAUZE/BANDAGES/DRESSINGS) ×1 IMPLANT
BENZOIN TINCTURE PRP APPL 2/3 (GAUZE/BANDAGES/DRESSINGS) ×3 IMPLANT
BLADE SAG 18X100X1.27 (BLADE) ×5 IMPLANT
BLADE SAW SGTL 13.0X1.19X90.0M (BLADE) IMPLANT
BLADE SURG 15 STRL LF DISP TIS (BLADE) ×2 IMPLANT
BLADE SURG 15 STRL SS (BLADE) ×9
BNDG CMPR 9X6 STRL LF SNTH (GAUZE/BANDAGES/DRESSINGS) ×1
BNDG ESMARK 6X9 LF (GAUZE/BANDAGES/DRESSINGS) ×3
BOWL SMART MIX CTS (DISPOSABLE) ×3 IMPLANT
BUR SURG 4X8 MED (BURR) IMPLANT
BURR SURG 4MMX8MM MEDIUM (BURR)
BURR SURG 4X8 MED (BURR)
CEMENT BONE SIMPLEX SPEEDSET (Cement) ×3 IMPLANT
CLOSURE WOUND 1/2 X4 (GAUZE/BANDAGES/DRESSINGS) ×1
COVER BACK TABLE 60X90IN (DRAPES) ×3 IMPLANT
CUFF TOURNIQUET SINGLE 34IN LL (TOURNIQUET CUFF) ×3 IMPLANT
DECANTER SPIKE VIAL GLASS SM (MISCELLANEOUS) ×3 IMPLANT
DRAPE EXTREMITY T 121X128X90 (DRAPE) ×3 IMPLANT
DRAPE INCISE IOBAN 66X45 STRL (DRAPES) ×3 IMPLANT
DRAPE U-SHAPE 47X51 STRL (DRAPES) ×3 IMPLANT
DRSG MEPILEX BORDER 4X8 (GAUZE/BANDAGES/DRESSINGS) ×3 IMPLANT
DURAPREP 26ML APPLICATOR (WOUND CARE) ×3 IMPLANT
ELECT REM PT RETURN 9FT ADLT (ELECTROSURGICAL) ×3
ELECTRODE REM PT RTRN 9FT ADLT (ELECTROSURGICAL) ×1 IMPLANT
FEMORAL KNEE TROCH XL 8.5X5.0 (Orthopedic Implant) ×2 IMPLANT
GAUZE XEROFORM 5X9 LF (GAUZE/BANDAGES/DRESSINGS) ×3 IMPLANT
GLOVE BIO SURGEON STRL SZ 6.5 (GLOVE) ×1 IMPLANT
GLOVE BIO SURGEONS STRL SZ 6.5 (GLOVE) ×1
GLOVE BIOGEL PI IND STRL 7.0 (GLOVE) ×1 IMPLANT
GLOVE BIOGEL PI IND STRL 8 (GLOVE) ×2 IMPLANT
GLOVE BIOGEL PI INDICATOR 7.0 (GLOVE) ×6
GLOVE BIOGEL PI INDICATOR 8 (GLOVE) ×4
GLOVE ECLIPSE 6.5 STRL STRAW (GLOVE) ×3 IMPLANT
GLOVE ECLIPSE 7.0 STRL STRAW (GLOVE) ×3 IMPLANT
GLOVE SURG ORTHO 8.0 STRL STRW (GLOVE) ×3 IMPLANT
GOWN STRL REUS W/ TWL LRG LVL3 (GOWN DISPOSABLE) ×1 IMPLANT
GOWN STRL REUS W/ TWL XL LVL3 (GOWN DISPOSABLE) ×2 IMPLANT
GOWN STRL REUS W/TWL LRG LVL3 (GOWN DISPOSABLE) ×3
GOWN STRL REUS W/TWL XL LVL3 (GOWN DISPOSABLE) ×6
HANDPIECE INTERPULSE COAX TIP (DISPOSABLE) ×3
IMMOBILIZER KNEE 22 UNIV (SOFTGOODS) ×3 IMPLANT
IMMOBILIZER KNEE 24 THIGH 36 (MISCELLANEOUS) ×1 IMPLANT
IMMOBILIZER KNEE 24 UNIV (MISCELLANEOUS) ×3
KIT PIN (KITS) IMPLANT
KNEE WRAP E Z 3 GEL PACK (MISCELLANEOUS) ×3 IMPLANT
NDL SAFETY ECLIPSE 18X1.5 (NEEDLE) ×2 IMPLANT
NEEDLE HYPO 18GX1.5 SHARP (NEEDLE) ×6
NS IRRIG 1000ML POUR BTL (IV SOLUTION) ×3 IMPLANT
PACK ARTHROSCOPY DSU (CUSTOM PROCEDURE TRAY) ×3 IMPLANT
PACK BASIN DAY SURGERY FS (CUSTOM PROCEDURE TRAY) ×3 IMPLANT
PAD CAST 4YDX4 CTTN HI CHSV (CAST SUPPLIES) IMPLANT
PADDING CAST COTTON 4X4 STRL (CAST SUPPLIES)
PADDING CAST COTTON 6X4 STRL (CAST SUPPLIES) IMPLANT
PATELLA 32MMX10MM (Knees) ×3 IMPLANT
PENCIL BUTTON HOLSTER BLD 10FT (ELECTRODE) ×3 IMPLANT
POST TAPER 11MM (Orthopedic Implant) ×2 IMPLANT
SET HNDPC FAN SPRY TIP SCT (DISPOSABLE) ×1 IMPLANT
SLEEVE SCD COMPRESS KNEE MED (MISCELLANEOUS) ×2 IMPLANT
SPONGE LAP 18X18 X RAY DECT (DISPOSABLE) ×3 IMPLANT
STAPLER VISISTAT 35W (STAPLE) IMPLANT
STRIP CLOSURE SKIN 1/2X4 (GAUZE/BANDAGES/DRESSINGS) ×2 IMPLANT
SUCTION FRAZIER HANDLE 10FR (MISCELLANEOUS) ×2
SUCTION TUBE FRAZIER 10FR DISP (MISCELLANEOUS) ×1 IMPLANT
SUT FIBERWIRE #2 38 T-5 BLUE (SUTURE)
SUT MNCRL AB 4-0 PS2 18 (SUTURE) ×3 IMPLANT
SUT MON AB 2-0 CT1 36 (SUTURE) ×2 IMPLANT
SUT VIC AB 0 CT1 27 (SUTURE)
SUT VIC AB 0 CT1 27XBRD ANBCTR (SUTURE) IMPLANT
SUT VIC AB 0 SH 27 (SUTURE) IMPLANT
SUT VIC AB 1 CT1 27 (SUTURE) ×6
SUT VIC AB 1 CT1 27XBRD ANBCTR (SUTURE) IMPLANT
SUT VIC AB 2-0 SH 27 (SUTURE) ×3
SUT VIC AB 2-0 SH 27XBRD (SUTURE) ×1 IMPLANT
SUT VICRYL 3-0 CR8 SH (SUTURE) ×2 IMPLANT
SUTURE FIBERWR #2 38 T-5 BLUE (SUTURE) IMPLANT
SYR 50ML LL SCALE MARK (SYRINGE) ×3 IMPLANT
SYR BULB 3OZ (MISCELLANEOUS) ×5 IMPLANT
TOWEL OR 17X24 6PK STRL BLUE (TOWEL DISPOSABLE) ×3 IMPLANT
TOWEL OR NON WOVEN STRL DISP B (DISPOSABLE) ×6 IMPLANT
TUBE CONNECTING 20'X1/4 (TUBING) ×1
TUBE CONNECTING 20X1/4 (TUBING) ×2 IMPLANT
YANKAUER SUCT BULB TIP NO VENT (SUCTIONS) ×3 IMPLANT

## 2017-04-27 NOTE — Interval H&P Note (Signed)
History and Physical Interval Note:  04/27/2017 12:37 PM  [name]  has presented today for surgery, with the diagnosis of OA RIGHT KNEE  The various methods of treatment have been discussed with the patient and family. After consideration of risks, benefits and other options for treatment, the patient has consented to  Procedure(s): PATELLA-FEMORAL ARTHROPLASTY (Right) as a surgical intervention .  The patient's history has been reviewed, patient examined, no change in status, stable for surgery.  I have reviewed the patient's chart and labs.  Questions were answered to the patient's satisfaction.     [name] D

## 2017-04-27 NOTE — Anesthesia Preprocedure Evaluation (Signed)
Anesthesia Evaluation  Patient identified by MRN, date of birth, ID band Patient awake    Reviewed: Allergy & Precautions, H&P , NPO status , Patient's Chart, lab work & pertinent test results  History of Anesthesia Complications (+) PONV and history of anesthetic complications  Airway Mallampati: II   Neck ROM: full    Dental   Pulmonary Current Smoker,    breath sounds clear to auscultation       Cardiovascular + Peripheral Vascular Disease   Rhythm:regular Rate:Normal     Neuro/Psych Seizures -,  PSYCHIATRIC DISORDERS Anxiety Depression  Neuromuscular disease    GI/Hepatic GERD  ,  Endo/Other    Renal/GU      Musculoskeletal  (+) Arthritis ,   Abdominal   Peds  Hematology   Anesthesia Other Findings   Reproductive/Obstetrics                             Anesthesia Physical Anesthesia Plan  ASA: II  Anesthesia Plan: General   Post-op Pain Management:  Regional for Post-op pain   Induction: Intravenous  PONV Risk Score and Plan: 3 and Ondansetron, Dexamethasone, Midazolam and Treatment may vary due to age or medical condition  Airway Management Planned: LMA  Additional Equipment:   Intra-op Plan:   Post-operative Plan:   Informed Consent: I have reviewed the patients History and Physical, chart, labs and discussed the procedure including the risks, benefits and alternatives for the proposed anesthesia with the patient or authorized representative who has indicated his/her understanding and acceptance.     Plan Discussed with: CRNA, Anesthesiologist and Surgeon  Anesthesia Plan Comments:         Anesthesia Quick Evaluation

## 2017-04-27 NOTE — Transfer of Care (Signed)
Immediate Anesthesia Transfer of Care Note  Patient: [name]  Procedure(s) Performed: PATELLA-FEMORAL ARTHROPLASTY (Right Knee)  Patient Location: PACU  Anesthesia Type:General  Level of Consciousness: awake, alert  and oriented  Airway & Oxygen Therapy: Patient Spontanous Breathing and Patient connected to face mask oxygen  Post-op Assessment: Report given to RN and Post -op Vital signs reviewed and stable  Post vital signs: Reviewed and stable  Last Vitals:  Vitals:   04/27/17 1219 04/27/17 1222  BP:    Pulse: 83 90  Resp: 11 16  Temp:    SpO2: 100% 100%    Last Pain:  Vitals:   04/27/17 1041  TempSrc: Oral         Complications: No apparent anesthesia complications

## 2017-04-27 NOTE — Anesthesia Procedure Notes (Signed)
Procedure Name: LMA Insertion Performed by: Karen KitchensKelly, Kattaleya Alia M, CRNA Pre-anesthesia Checklist: Patient identified, Emergency Drugs available, Suction available, Patient being monitored and Timeout performed Patient Re-evaluated:Patient Re-evaluated prior to induction Preoxygenation: Pre-oxygenation with 100% oxygen Induction Type: IV induction Ventilation: Mask ventilation without difficulty LMA: LMA inserted LMA Size: 3.0 Tube type: Oral Number of attempts: 1 Placement Confirmation: positive ETCO2,  CO2 detector and breath sounds checked- equal and bilateral Tube secured with: Tape Dental Injury: Teeth and Oropharynx as per pre-operative assessment

## 2017-04-27 NOTE — Op Note (Signed)
04/27/2017  12:57 PM  PATIENT:  [name]    PRE-OPERATIVE DIAGNOSIS:  OA RIGHT KNEE  POST-OPERATIVE DIAGNOSIS:  Same  PROCEDURE:  PATELLA-FEMORAL ARTHROPLASTY  SURGEON:  Kiauna Zywicki, Jewel BaizeIMOTHY D, MD  ASSISTANT: Richardson Landryan Marcques Wrightsman MD  ANESTHESIA:   gen  PREOPERATIVE INDICATIONS:  Brittany MasseWandora P Euceda is a  46 y.o. female with a diagnosis of OA RIGHT KNEE who failed conservative measures and elected for surgical management.    The risks benefits and alternatives were discussed with the patient preoperatively including but not limited to the risks of infection, bleeding, nerve injury, cardiopulmonary complications, the need for revision surgery, among others, and the patient was willing to proceed.  OPERATIVE IMPLANTS: Stryker PF uni 5 x 8.5. 32 patella  COMPLICATIONS: nonne  TOURNIQUET TIME: 60min  OPERATIVE PROCEDURE:  Patient was identified in the preoperative holding area and site was marked by me She was transported to the operating theater and placed on the table in supine position taking care to pad all bony prominences. After a preincinduction time out anesthesia was induced. The Right lower extremity was prepped and draped in normal sterile fashion and a pre-incision timeout was performed. She received Vanc and ancef for preoperative antibiotics.  She was given mupiricin for multiple days and a betadine nasal swab pre op  Amended anterior incision dissected down to her joint capsule I then made a medial parapatellar arthrotomy.  Inspected her knee tibiofemoral joint spaces had excellent cartilage no pathology.  The patellofemoral space had severe stage IV arthritis.  I sized her femoral groove for an appropriate sized implant.  I then performed a debridement of this cartilage using sequential reamers as per the normal technique.  I pinned our guides in the place and pinned the central peg location and then sequentially tapped and reamed the central peg and implanted here after  thorough irrigation.   Implanted our femoral component and impacted this into place.  I then performed a thorough irrigation.  Next I everted her patella and performed a patellar cut.  I sized and placed a 32 patellar implant after thorough irrigation cementing this in the place.  Her tracking was excellent  I closed the arthrotomy followed by the skin in layers.  A sterile dressing was applied she was awoken and taken the PACU in stable condition  POST OPERATIVE PLAN: WBAT, chemical dvt px

## 2017-04-27 NOTE — Progress Notes (Addendum)
Assisted Dr. Chaney MallingHodierne with right, ultrasound guided, adductor canal block. Side rails up, monitors on throughout procedure. See vital signs in flow sheet. Tolerated Procedure well.re. See vital signs in flow sheet. Tolerated Procedure well.

## 2017-04-27 NOTE — Discharge Instructions (Signed)
Keep dressing C/D/I Start PT right away WBAT  Post Anesthesia Home Care Instructions  Activity: Get plenty of rest for the remainder of the day. A responsible individual must stay with you for 24 hours following the procedure.  For the next 24 hours, DO NOT: -Drive a car -Advertising copywriterperate machinery -Drink alcoholic beverages -Take any medication unless instructed by your physician -Make any legal decisions or sign important papers.  Meals: Start with liquid foods such as gelatin or soup. Progress to regular foods as tolerated. Avoid greasy, spicy, heavy foods. If nausea and/or vomiting occur, drink only clear liquids until the nausea and/or vomiting subsides. Call your physician if vomiting continues.  Special Instructions/Symptoms: Your throat may feel dry or sore from the anesthesia or the breathing tube placed in your throat during surgery. If this causes discomfort, gargle with warm salt water. The discomfort should disappear within 24 hours.  If you had a scopolamine patch placed behind your ear for the management of post- operative nausea and/or vomiting:  1. The medication in the patch is effective for 72 hours, after which it should be removed.  Wrap patch in a tissue and discard in the trash. Wash hands thoroughly with soap and water. 2. You may remove the patch earlier than 72 hours if you experience unpleasant side effects which may include dry mouth, dizziness or visual disturbances. 3. Avoid touching the patch. Wash your hands with soap and water after contact with the patch.      Regional Anesthesia Blocks  1. Numbness or the inability to move the "blocked" extremity may last from 3-48 hours after placement. The length of time depends on the medication injected and your individual response to the medication. If the numbness is not going away after 48 hours, call your surgeon.  2. The extremity that is blocked will need to be protected until the numbness is gone and the   Strength has returned. Because you cannot feel it, you will need to take extra care to avoid injury. Because it may be weak, you may have difficulty moving it or using it. You may not know what position it is in without looking at it while the block is in effect.  3. For blocks in the legs and feet, returning to weight bearing and walking needs to be done carefully. You will need to wait until the numbness is entirely gone and the strength has returned. You should be able to move your leg and foot normally before you try and bear weight or walk. You will need someone to be with you when you first try to ensure you do not fall and possibly risk injury.  4. Bruising and tenderness at the needle site are common side effects and will resolve in a few days.  5. Persistent numbness or new problems with movement should be communicated to the surgeon or the [facility] [phone])/ [facility] [phone]).

## 2017-04-28 DIAGNOSIS — M1731 Unilateral post-traumatic osteoarthritis, right knee: Secondary | ICD-10-CM | POA: Diagnosis not present

## 2017-04-28 NOTE — Anesthesia Postprocedure Evaluation (Signed)
Anesthesia Post Note  Patient: [name]  Procedure(s) Performed: PATELLA-FEMORAL ARTHROPLASTY (Right Knee)     Patient location during evaluation: PACU Anesthesia Type: General Level of consciousness: awake and alert Pain management: pain level controlled Vital Signs Assessment: post-procedure vital signs reviewed and stable Respiratory status: spontaneous breathing, nonlabored ventilation, respiratory function stable and patient connected to nasal cannula oxygen Cardiovascular status: blood pressure returned to baseline and stable Postop Assessment: no apparent nausea or vomiting Anesthetic complications: no    Last Vitals:  Vitals:   04/28/17 0300 04/28/17 0657  BP:  (!) 101/97  Pulse: 78 80  Resp: 14 16  Temp:  36.7 C  SpO2: 97% 97%    Last Pain:  Vitals:   04/28/17 0657  TempSrc:   PainSc: 5                  Cheyan Frees S

## 2017-04-30 ENCOUNTER — Ambulatory Visit: Payer: BLUE CROSS/BLUE SHIELD | Admitting: Physical Therapy

## 2017-05-02 ENCOUNTER — Encounter (HOSPITAL_BASED_OUTPATIENT_CLINIC_OR_DEPARTMENT_OTHER): Payer: Self-pay | Admitting: Orthopedic Surgery

## 2017-05-03 NOTE — Addendum Note (Signed)
Addendum  created 05/03/17 0831 by Lance CoonWebster, Adrian, CRNA   Charge Capture section accepted

## 2017-05-07 ENCOUNTER — Ambulatory Visit: Payer: BLUE CROSS/BLUE SHIELD | Attending: Orthopedic Surgery | Admitting: Physical Therapy

## 2018-01-25 IMAGING — CR DG KNEE 1-2V*R*
2 series · 2 of 2 positions shown · non-contrast
Comparison: Portable exam 1824 hours compared to 01/29/2016

CLINICAL DATA: RIGHT knee arthroplasty

EXAM:
RIGHT KNEE - 1-2 VIEW

[ap]
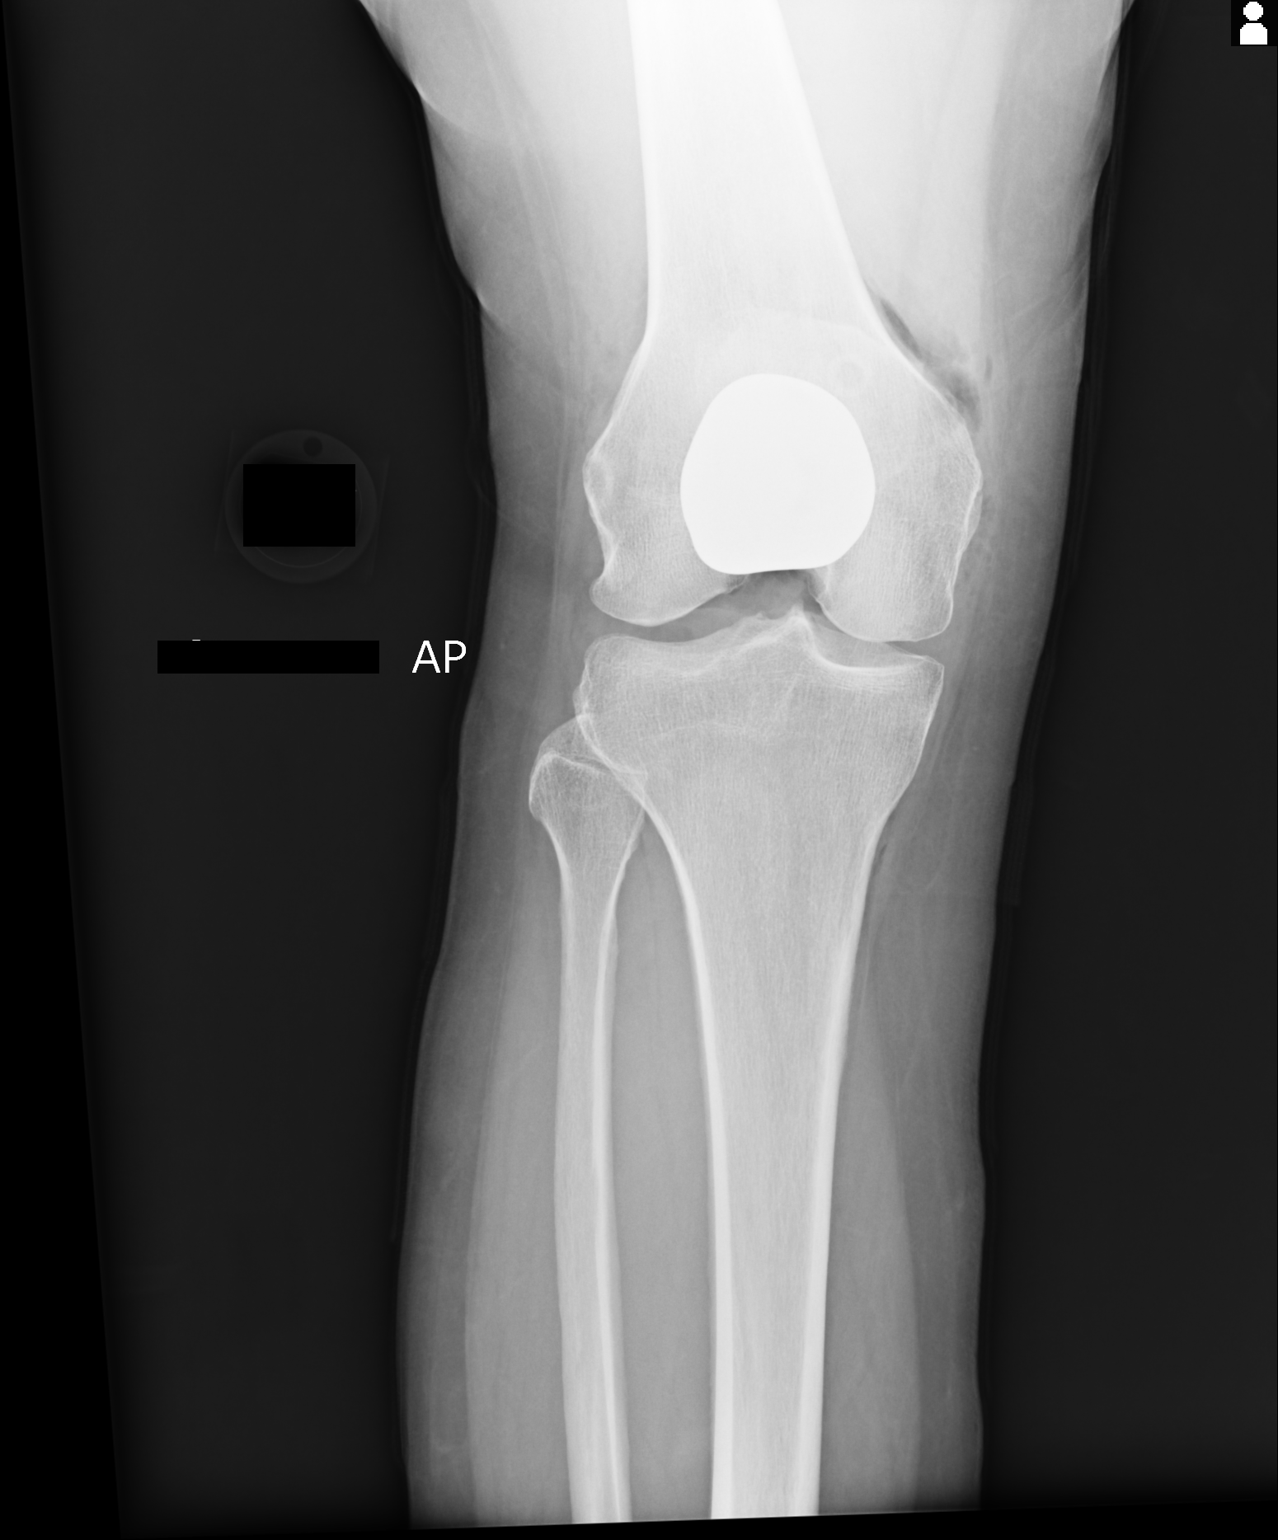

[lat]
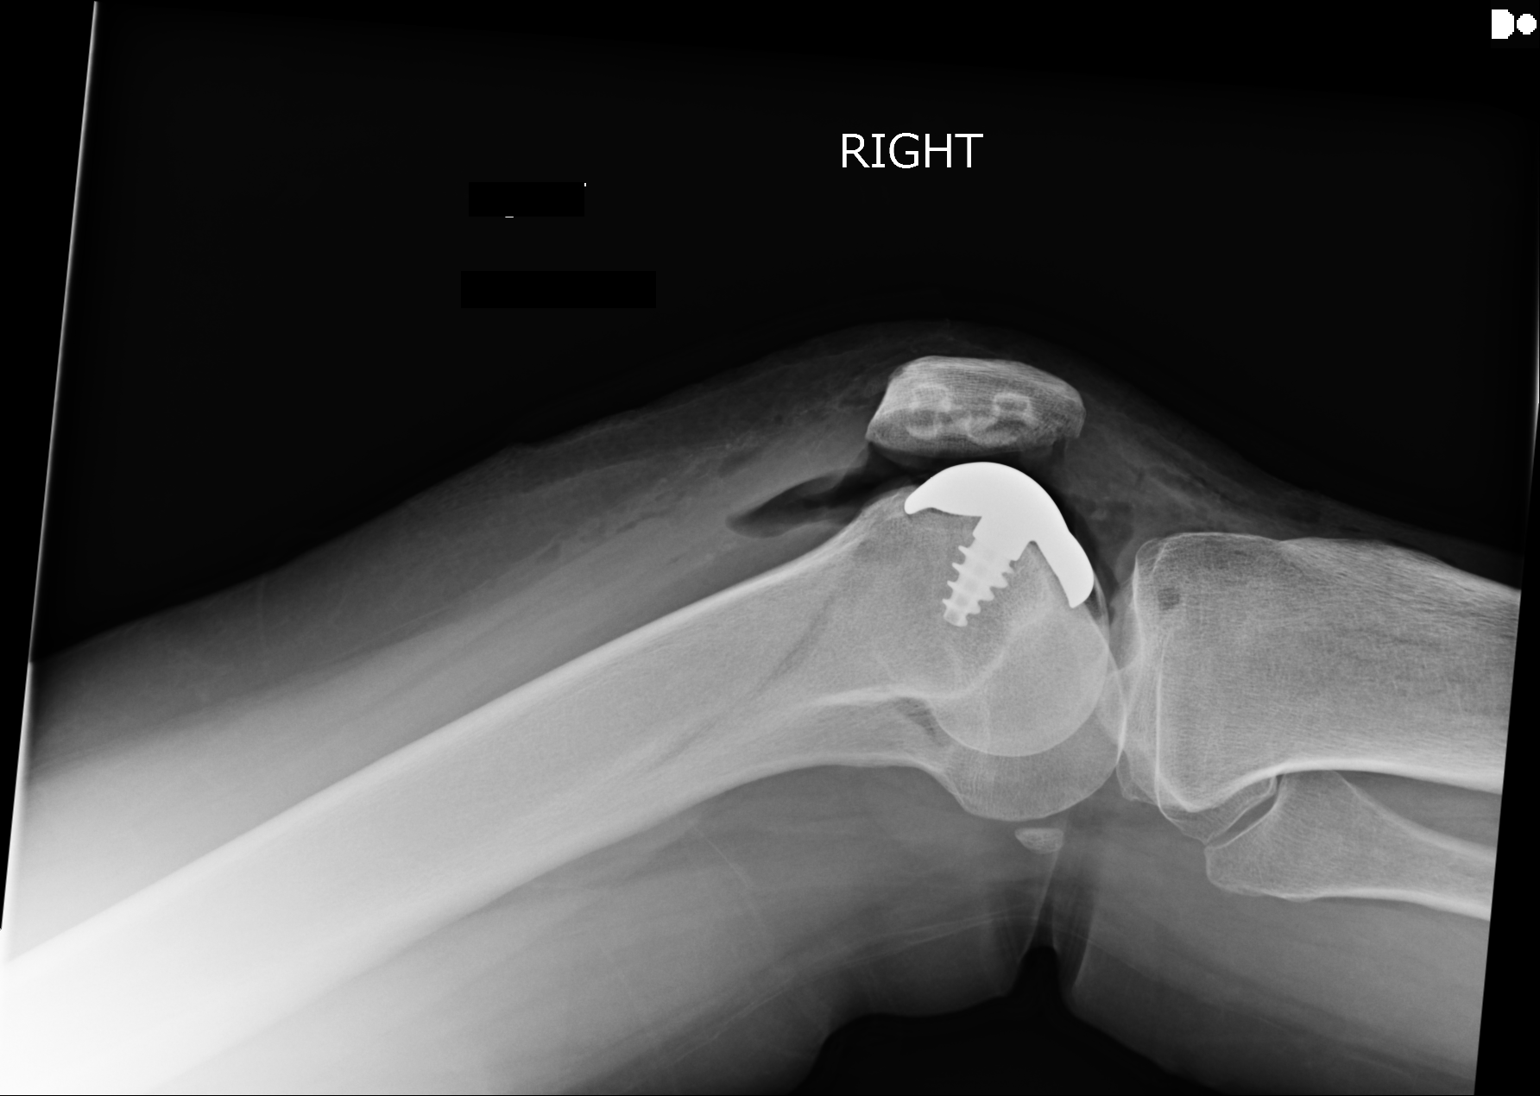

[2 of 2 positions shown; findings below may reference images not displayed]

FINDINGS: Patellofemoral joint prosthesis identified.

No acute fracture or dislocation.

Osseous mineralization normal.

Air within the joint related to surgery.
IMPRESSION: Patellofemoral arthroplasty without definite acute bony
abnormalities.

## 2018-03-25 ENCOUNTER — Other Ambulatory Visit: Payer: Self-pay | Admitting: Family Medicine

## 2018-03-25 DIAGNOSIS — R7989 Other specified abnormal findings of blood chemistry: Secondary | ICD-10-CM

## 2018-03-25 DIAGNOSIS — R634 Abnormal weight loss: Secondary | ICD-10-CM

## 2018-03-28 ENCOUNTER — Ambulatory Visit
Admission: RE | Admit: 2018-03-28 | Discharge: 2018-03-28 | Disposition: A | Discharge status: Home / Self Care | Payer: BLUE CROSS/BLUE SHIELD | Source: Ambulatory Visit | Attending: Family Medicine | Admitting: Family Medicine

## 2018-03-28 DIAGNOSIS — R7989 Other specified abnormal findings of blood chemistry: Secondary | ICD-10-CM

## 2018-03-28 DIAGNOSIS — R634 Abnormal weight loss: Secondary | ICD-10-CM

## 2018-04-02 ENCOUNTER — Ambulatory Visit (INDEPENDENT_AMBULATORY_CARE_PROVIDER_SITE_OTHER): Payer: Self-pay

## 2018-04-02 ENCOUNTER — Encounter (INDEPENDENT_AMBULATORY_CARE_PROVIDER_SITE_OTHER): Payer: Self-pay | Admitting: Physical Medicine and Rehabilitation

## 2018-04-02 ENCOUNTER — Ambulatory Visit (INDEPENDENT_AMBULATORY_CARE_PROVIDER_SITE_OTHER): Payer: BLUE CROSS/BLUE SHIELD | Admitting: Physical Medicine and Rehabilitation

## 2018-04-02 VITALS — BP 111/63 | HR 93

## 2018-04-02 DIAGNOSIS — M5441 Lumbago with sciatica, right side: Secondary | ICD-10-CM

## 2018-04-02 DIAGNOSIS — S39012D Strain of muscle, fascia and tendon of lower back, subsequent encounter: Secondary | ICD-10-CM

## 2018-04-02 DIAGNOSIS — M7918 Myalgia, other site: Secondary | ICD-10-CM | POA: Diagnosis not present

## 2018-04-02 NOTE — Progress Notes (Signed)
  Numeric Pain Rating Scale and Functional Assessment Average Pain 10 Pain Right Now 0 My pain is constant and sharp Pain is worse with: standing Pain improves with: medication   In the last MONTH (on 0-10 scale) has pain interfered with the following?  1. General activity like being  able to carry out your everyday physical activities such as walking, climbing stairs, carrying groceries, or moving a chair?  Rating(10)  2. Relation with others like being able to carry out your usual social activities and roles such as  activities at home, at work and in your community. Rating(10)  3. Enjoyment of life such that you have  been bothered by emotional problems such as feeling anxious, depressed or irritable?  Rating(9)

## 2018-04-24 ENCOUNTER — Encounter (INDEPENDENT_AMBULATORY_CARE_PROVIDER_SITE_OTHER): Payer: Self-pay | Admitting: Physical Medicine and Rehabilitation
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
## 2018-07-07 ENCOUNTER — Encounter: Payer: Self-pay | Admitting: Endocrinology

## 2018-07-07 NOTE — Progress Notes (Signed)
This encounter was created in error - please disregard.

## 2018-07-08 ENCOUNTER — Encounter: Payer: BLUE CROSS/BLUE SHIELD | Admitting: Endocrinology

## 2018-07-09 ENCOUNTER — Telehealth: Payer: Self-pay | Admitting: Endocrinology

## 2018-07-09 NOTE — Telephone Encounter (Signed)
Patient no showed [id number]. Please advise on how to follow up.  A. No follow up necessary. B. Follow up urgent. Contact patient immediately. C. Follow up necessary. Contact patient and schedule visit in ___ days. D. Follow up advised. Contact patient and schedule visit in ____weeks.  Would you like the NS fee to be applied to this visit?

## 2018-07-09 NOTE — Telephone Encounter (Signed)
Please notify PCP patient did not show.  Will need to be reconsulted today contact patient

## 2019-02-03 ENCOUNTER — Other Ambulatory Visit: Payer: Self-pay | Admitting: Orthopedic Surgery

## 2019-02-03 DIAGNOSIS — M545 Low back pain, unspecified: Secondary | ICD-10-CM

## 2019-02-26 ENCOUNTER — Other Ambulatory Visit: Payer: BLUE CROSS/BLUE SHIELD

## 2020-08-20 IMAGING — US US THYROID
1 series · 13 of 25 positions shown · non-contrast
Comparison: None.

CLINICAL DATA: Hyperthyroid.  Weight loss.

EXAM:
THYROID ULTRASOUND
TECHNIQUE: Ultrasound examination of the thyroid gland and adjacent soft
tissues was performed.

[Series 1: us thyroid · 0.08mm/px · 13 of 39 slices shown]
[im 1/39]
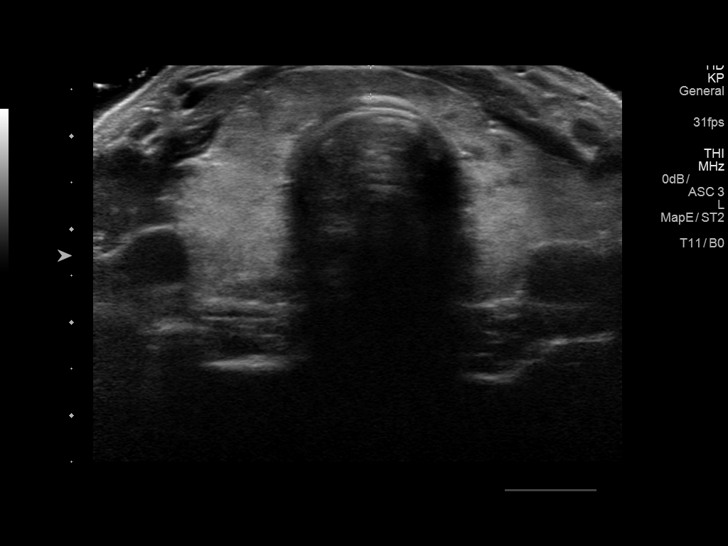
[im 4/39]
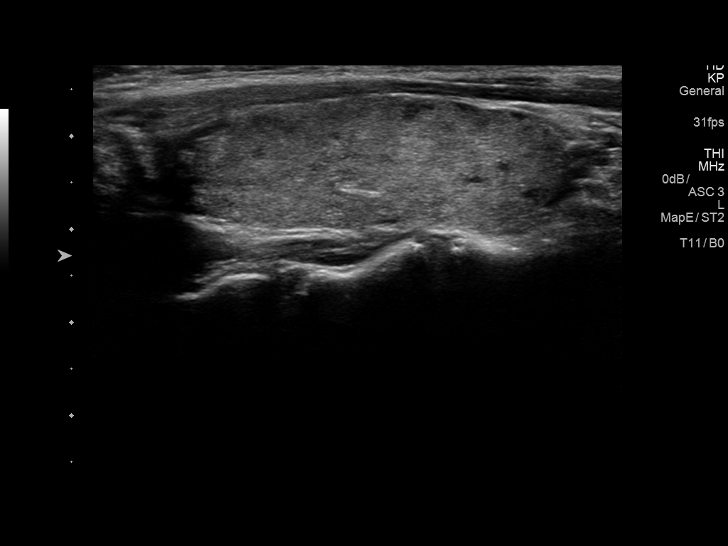
[im 7/39]
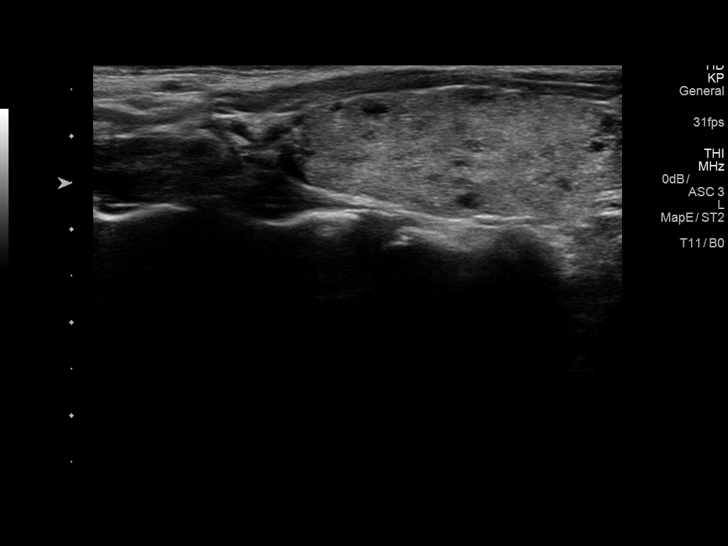
[im 10/39]
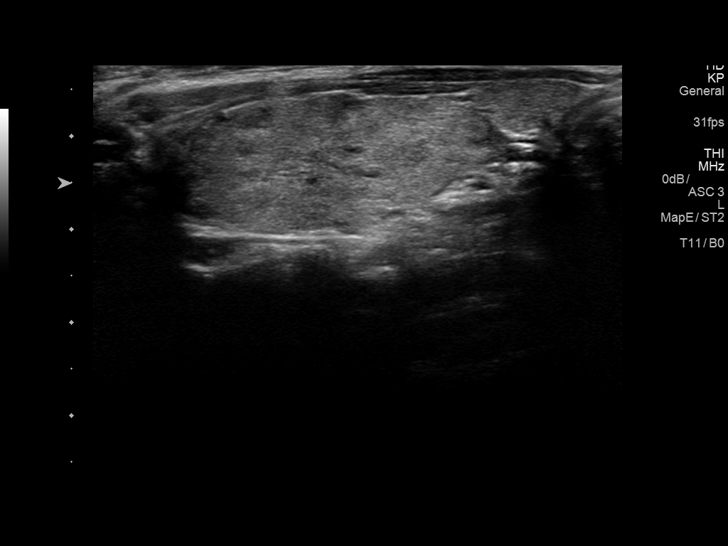
[im 13/39]
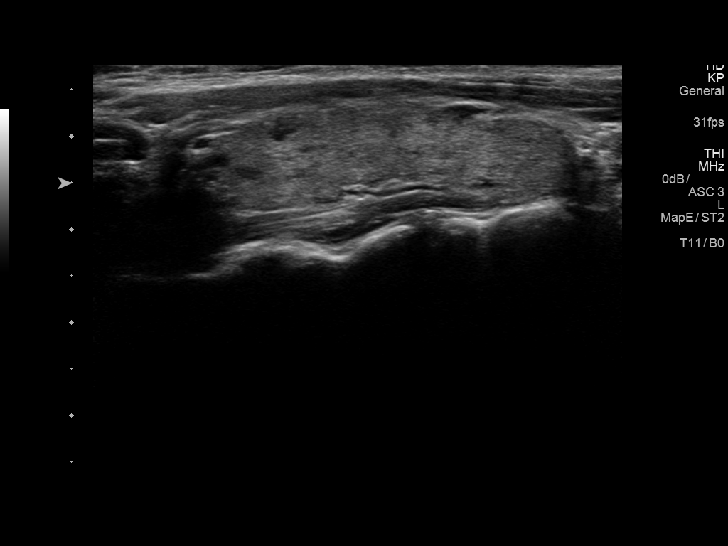
[im 16/39]
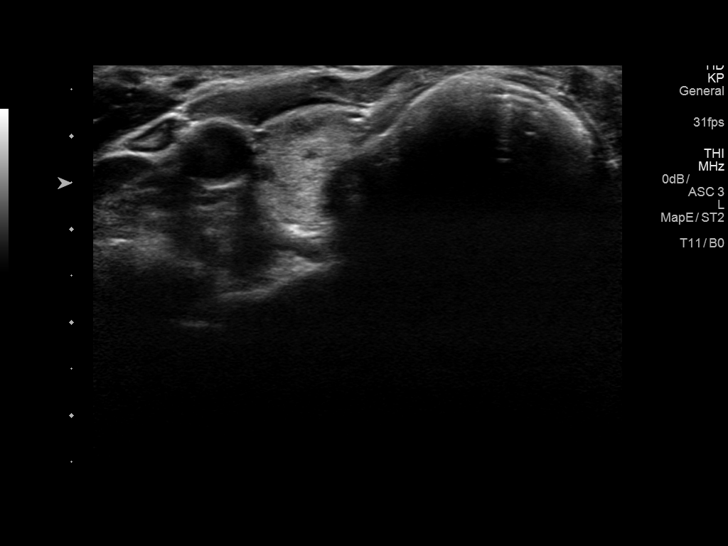
[im 20/39]
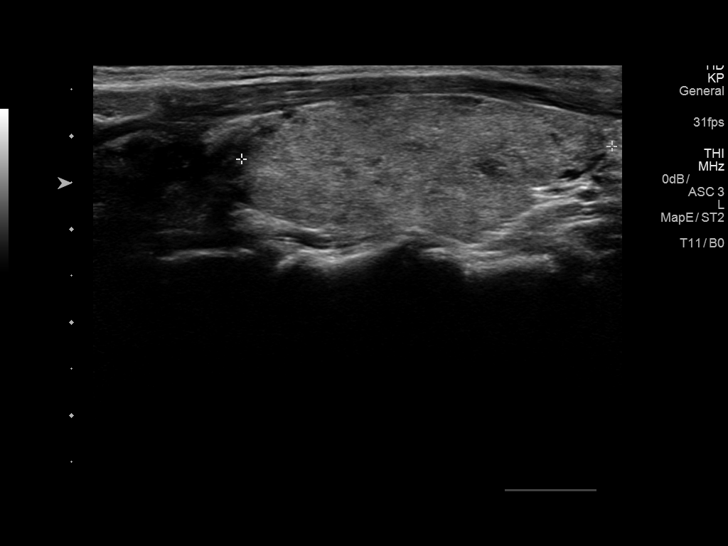
[im 23/39]
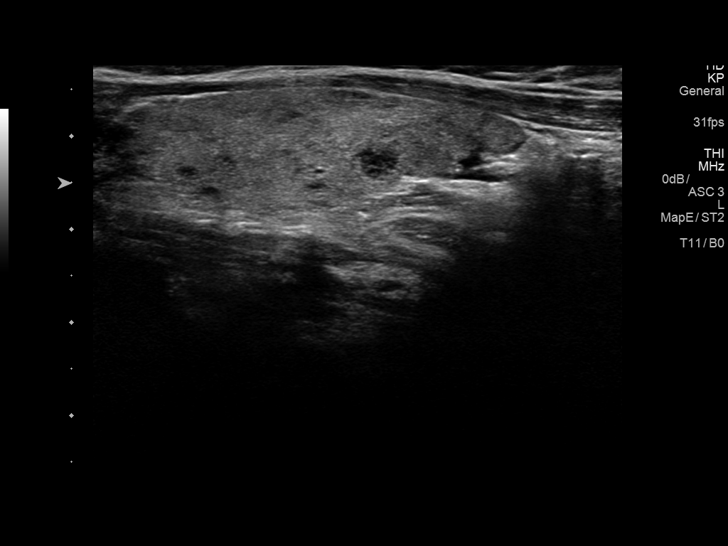
[im 26/39]
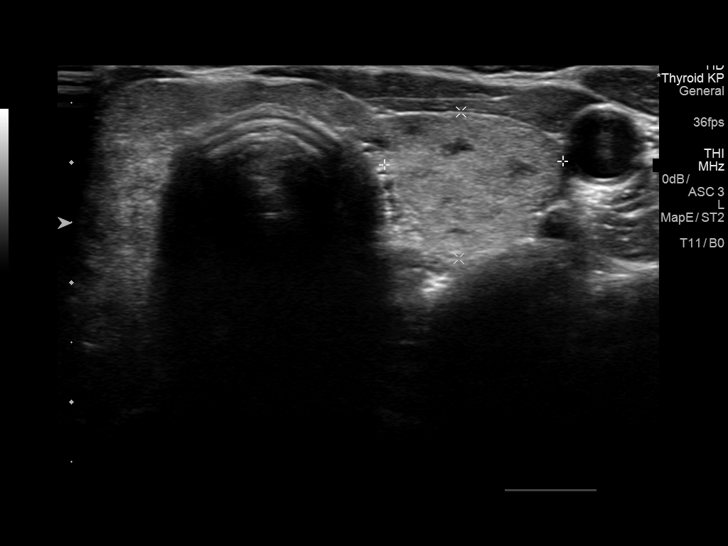
[im 29/39]
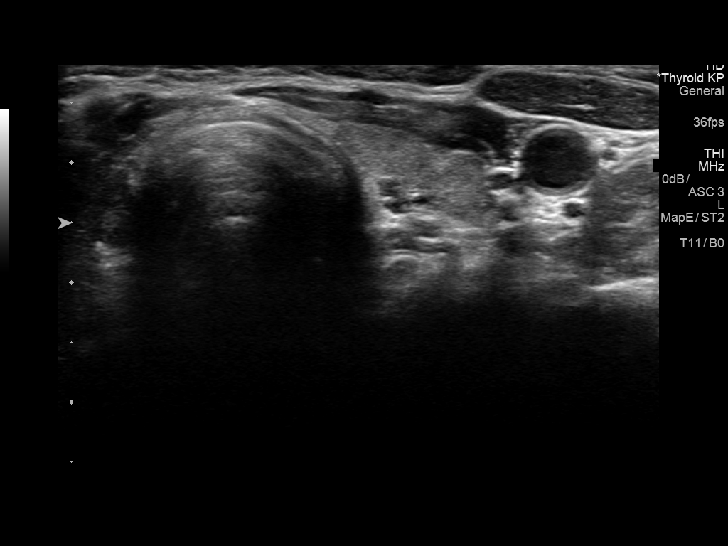
[im 32/39]
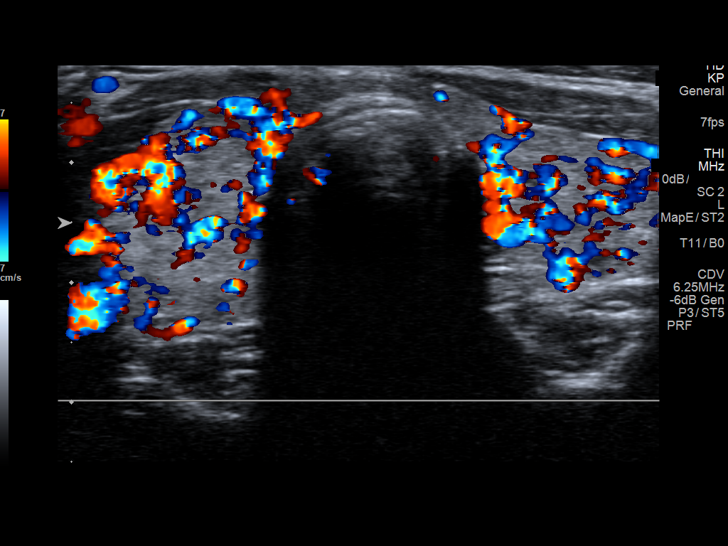
[im 35/39]
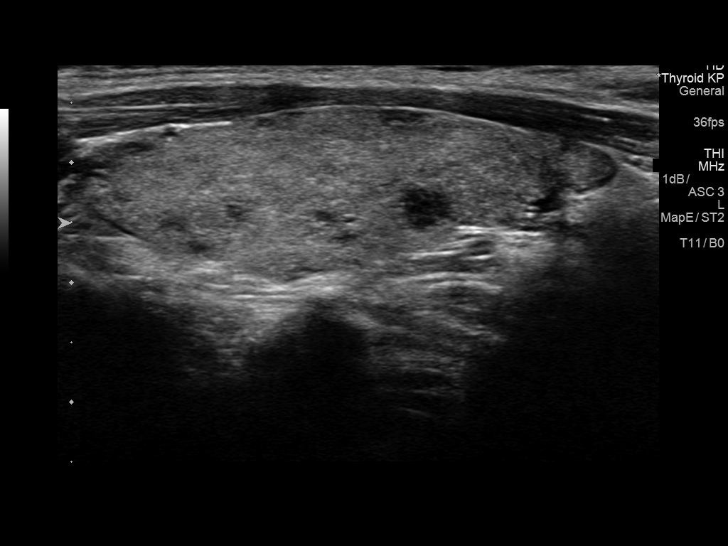
[im 39/39]
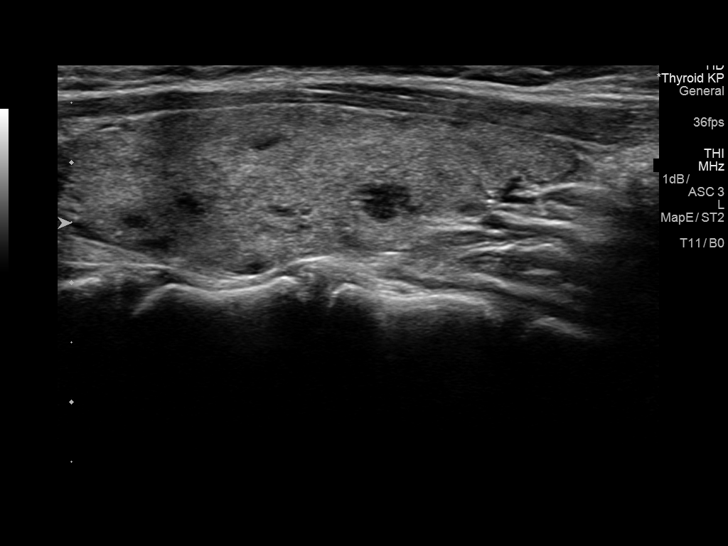

[13 of 25 positions shown; findings below may reference images not displayed]

FINDINGS: Parenchymal Echotexture: Moderately heterogenous - the gland appears
diffusely hyperemic (representative images 33, 35 and 37).

Isthmus: Normal in size measures 0.3 cm in diameter

Right lobe: Normal in size measuring 4.2 x 1.7 x 1.6 cm

Left lobe: Normal in size measuring 4.4 x 1.2 x 1.5 cm

_________________________________________________________

Estimated total number of nodules >/= 1 cm: 1

Number of spongiform nodules >/=  2 cm not described below (TR1): 0

Number of mixed cystic and solid nodules >/= 1.5 cm not described
below (TR2): 0

_________________________________________________________

Nodule # 1:

Location: Right; Inferior

Maximum size: 1.2 cm; Other 2 dimensions: 0.8 x 0.7 cm

Composition: solid/almost completely solid (2)

Echogenicity: isoechoic (1)

Shape: not taller-than-wide (0)

Margins: extra-thyroidal extension (3)

Echogenic foci: none (0)

ACR TI-RADS total points: 6.

ACR TI-RADS risk category: TR4 (4-6 points).

ACR TI-RADS recommendations:

*Given size (>/= 1 - 1.4 cm) and appearance, a follow-up ultrasound
in 1 year should be considered based on TI-RADS criteria.

_________________________________________________________

Questioned approximately 0.9 cm nodule arising from the inferior
medial aspect of the left lobe of the thyroid (labeled 2), is
favored to represent a pseudonodule/lobular extension the thyroid
gland as it lacks defined borders both the provided longitudinal and
transverse images.

Questioned ill-defined approximately 0.6 cm nodule arising from the
inferior pole the left lobe of the thyroid (labeled 3), is favored
to represent a pseudonodule/lobular extension of the thyroid gland
as it lacks defined borders on both the provided longitudinal and
transverse images.
IMPRESSION: 1. Moderately heterogeneous and apparently hyperemic thyroid gland -
nonspecific though could be seen in the setting of a thyroiditis.
Clinical correlation is advised.
2. Nodule #1 meets imaging criteria to recommend a 1 year follow-up.
